# Patient Record
Sex: Female | Born: 1965 | Race: White | Hispanic: No | Marital: Married | Smoking: Current every day smoker
Health system: Southern US, Community
[De-identification: ages and names within clinical notes are randomized; demographics above are authoritative.]

## PROBLEM LIST (undated history)

## (undated) DIAGNOSIS — E119 Type 2 diabetes mellitus without complications: Secondary | ICD-10-CM

## (undated) DIAGNOSIS — E785 Hyperlipidemia, unspecified: Secondary | ICD-10-CM

## (undated) DIAGNOSIS — F32A Depression, unspecified: Secondary | ICD-10-CM

## (undated) DIAGNOSIS — Z9989 Dependence on other enabling machines and devices: Secondary | ICD-10-CM

## (undated) DIAGNOSIS — F329 Major depressive disorder, single episode, unspecified: Secondary | ICD-10-CM

## (undated) DIAGNOSIS — K760 Fatty (change of) liver, not elsewhere classified: Secondary | ICD-10-CM

## (undated) DIAGNOSIS — J189 Pneumonia, unspecified organism: Secondary | ICD-10-CM

## (undated) DIAGNOSIS — M1990 Unspecified osteoarthritis, unspecified site: Secondary | ICD-10-CM

## (undated) DIAGNOSIS — I10 Essential (primary) hypertension: Secondary | ICD-10-CM

## (undated) DIAGNOSIS — G43909 Migraine, unspecified, not intractable, without status migrainosus: Secondary | ICD-10-CM

## (undated) DIAGNOSIS — K219 Gastro-esophageal reflux disease without esophagitis: Secondary | ICD-10-CM

## (undated) DIAGNOSIS — J329 Chronic sinusitis, unspecified: Secondary | ICD-10-CM

## (undated) DIAGNOSIS — I493 Ventricular premature depolarization: Secondary | ICD-10-CM

## (undated) DIAGNOSIS — G4733 Obstructive sleep apnea (adult) (pediatric): Secondary | ICD-10-CM

## (undated) HISTORY — PX: LAPAROSCOPIC CHOLECYSTECTOMY: SUR755

## (undated) HISTORY — DX: Essential (primary) hypertension: I10

## (undated) HISTORY — DX: Ventricular premature depolarization: I49.3

## (undated) HISTORY — DX: Depression, unspecified: F32.A

## (undated) HISTORY — DX: Hyperlipidemia, unspecified: E78.5

## (undated) HISTORY — DX: Unspecified osteoarthritis, unspecified site: M19.90

## (undated) HISTORY — DX: Gastro-esophageal reflux disease without esophagitis: K21.9

## (undated) HISTORY — DX: Major depressive disorder, single episode, unspecified: F32.9

## (undated) HISTORY — DX: Fatty (change of) liver, not elsewhere classified: K76.0

---

## 2004-11-01 DIAGNOSIS — J189 Pneumonia, unspecified organism: Secondary | ICD-10-CM

## 2004-11-01 HISTORY — DX: Pneumonia, unspecified organism: J18.9

## 2011-01-07 DIAGNOSIS — N879 Dysplasia of cervix uteri, unspecified: Secondary | ICD-10-CM | POA: Insufficient documentation

## 2011-01-07 DIAGNOSIS — K219 Gastro-esophageal reflux disease without esophagitis: Secondary | ICD-10-CM | POA: Insufficient documentation

## 2011-01-07 DIAGNOSIS — E785 Hyperlipidemia, unspecified: Secondary | ICD-10-CM | POA: Insufficient documentation

## 2012-12-27 LAB — HM PAP SMEAR: HM Pap smear: NEGATIVE

## 2013-04-23 DIAGNOSIS — M76821 Posterior tibial tendinitis, right leg: Secondary | ICD-10-CM | POA: Insufficient documentation

## 2014-12-04 DIAGNOSIS — I493 Ventricular premature depolarization: Secondary | ICD-10-CM | POA: Insufficient documentation

## 2015-11-02 HISTORY — PX: THROAT SURGERY: SHX803

## 2016-06-04 LAB — HM COLONOSCOPY

## 2017-03-01 HISTORY — PX: FOOT TENDON SURGERY: SHX958

## 2017-09-29 ENCOUNTER — Encounter: Payer: Self-pay | Admitting: Family Medicine

## 2017-10-11 ENCOUNTER — Ambulatory Visit: Payer: Self-pay | Admitting: Family Medicine

## 2017-10-13 ENCOUNTER — Ambulatory Visit: Payer: Self-pay | Admitting: Family Medicine

## 2017-10-13 ENCOUNTER — Encounter: Payer: Self-pay | Admitting: Family Medicine

## 2017-10-13 ENCOUNTER — Other Ambulatory Visit: Payer: Self-pay

## 2017-10-13 VITALS — BP 132/82 | HR 80 | Temp 98.4°F | Resp 18 | Ht 66.5 in | Wt 205.6 lb

## 2017-10-13 DIAGNOSIS — E785 Hyperlipidemia, unspecified: Secondary | ICD-10-CM

## 2017-10-13 DIAGNOSIS — E669 Obesity, unspecified: Secondary | ICD-10-CM

## 2017-10-13 DIAGNOSIS — F320 Major depressive disorder, single episode, mild: Secondary | ICD-10-CM

## 2017-10-13 DIAGNOSIS — F329 Major depressive disorder, single episode, unspecified: Secondary | ICD-10-CM | POA: Insufficient documentation

## 2017-10-13 DIAGNOSIS — Z23 Encounter for immunization: Secondary | ICD-10-CM

## 2017-10-13 DIAGNOSIS — G4733 Obstructive sleep apnea (adult) (pediatric): Secondary | ICD-10-CM

## 2017-10-13 DIAGNOSIS — E1169 Type 2 diabetes mellitus with other specified complication: Secondary | ICD-10-CM

## 2017-10-13 DIAGNOSIS — Z794 Long term (current) use of insulin: Secondary | ICD-10-CM

## 2017-10-13 DIAGNOSIS — M25559 Pain in unspecified hip: Secondary | ICD-10-CM

## 2017-10-13 DIAGNOSIS — M25552 Pain in left hip: Secondary | ICD-10-CM

## 2017-10-13 DIAGNOSIS — E119 Type 2 diabetes mellitus without complications: Secondary | ICD-10-CM

## 2017-10-13 DIAGNOSIS — M79671 Pain in right foot: Secondary | ICD-10-CM

## 2017-10-13 DIAGNOSIS — M25551 Pain in right hip: Secondary | ICD-10-CM

## 2017-10-13 DIAGNOSIS — I10 Essential (primary) hypertension: Secondary | ICD-10-CM

## 2017-10-13 DIAGNOSIS — G8929 Other chronic pain: Secondary | ICD-10-CM

## 2017-10-13 DIAGNOSIS — Z720 Tobacco use: Secondary | ICD-10-CM

## 2017-10-13 MED ORDER — DICLOFENAC SODIUM 50 MG PO TBEC: 50.0000 mg | DELAYED_RELEASE_TABLET | Freq: Two times a day (BID) | ORAL | 0 refills | Status: DC

## 2017-10-13 NOTE — Assessment & Plan Note (Signed)
She is on CPAP.  We will obtain her sleep study records.  We will need to find a local provider when she has insurance to continue her supplies

## 2017-10-13 NOTE — Assessment & Plan Note (Signed)
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  I did provide her with a letter for her work.  She will continue the gabapentin.  We will need to hold off on orthopedics until she has insurance and she will reestablish care for ongoing issues with her foot.  I will get the records from her previous orthopedist.

## 2017-10-13 NOTE — Assessment & Plan Note (Signed)
Blood pressure looks okay.  She is on lisinopril renal function. I will delay her cholesterol panel in her urine micro until the next visit hopefully she will have insurance by then.

## 2017-10-13 NOTE — Assessment & Plan Note (Signed)
Continue the 50 mg of the Zoloft for now.  She has enough medication to keep cutting in half.  We will see how she does not continue to try to taper her off.

## 2017-10-13 NOTE — Progress Notes (Addendum)
Subjective:    Patient ID: Heather Sloan, female    DOB: 08/11/1966, 51 y.o.   MRN: [id number]  Patient presents for new pt get established (not fasting)  Pt here to establish care  Previous PCP- Dr. Wyline MoodLavi Rohatgi  Specialist-  Orthopedics at [facility]    DM- Last A1C  8.3% in Feb 2018 , currently on Novolog 10 units with meals and Lantus 45 units at bedtime    Lisinopril, Metformin 1000mg  BID   Was on glipizide in past States she is glutean intolerant  Weight- was down to 185 about 1.5 years ago, drinks diet soda, water, coffee   Has strong family history   GERD- Prilosec    Hyperlipidemia- zocor 40mg , no difficulty   Depression/anxiety- Zoloft 100mg  , started weaning down a few months ago, doing okay on 50mg  states that she had been under a lot of stress with her sister-in-law when she was living with them.  Also her husband has a lot of medical problems which is 1 of the reasons [place].  Her children are going to be moving down in the next year as well.   Has had 2 surgeries on her post tibitial tendon on right foot- last in May 2018, Had Physical  , was in a cast for 12 weeks had severe muscle atrophy. Still has weakness in left, callus and pain on heel. Needs f/u with local orthopdics. She is on gabapentin for neve damage  Has chronic hip pain as well   Arthritis-  Diclofenac 40mg  BID  Hypertension- taking lisinopril without difficulty   OSA- wears CPAP,    Vitamin D taking supplement     Has had lapraoscopy procedure for endometrisos but not found   Carpal tunnel syndrome- wears at night    Works at [employer]- needs doctors note to sit at work due to foot pain  Currently uninsured   She has had mammogram/colonoscopy TDAP/Pneumonia UTD   Review Of Systems:  GEN- denies fatigue, fever, weight loss,weakness, recent illness HEENT- denies eye drainage, change in vision, nasal discharge, CVS- denies chest pain,  palpitations RESP- denies SOB, cough, wheeze ABD- denies N/V, change in stools, abd pain GU- denies dysuria, hematuria, dribbling, incontinence MSK- + joint pain, muscle aches, injury Neuro- denies headache, dizziness, syncope, seizure activity       Objective:    BP 132/82 (BP Location: Right Arm, Patient Position: Sitting, Cuff Size: Large)   Pulse 80   Temp 98.4 F (36.9 C) (Oral)   Resp 18   Ht 5' 6.5" (1.689 m)   Wt 205 lb 9.6 oz (93.3 kg)   LMP 09/02/2011 (Approximate)   BMI 32.69 kg/m  GEN- NAD, alert and oriented x3 HEENT- PERRL, EOMI, non injected sclera, pink conjunctiva, MMM, oropharynx clear Neck- Supple, no thyromegaly CVS- RRR, no murmur RESP-CTAB ABD-NABS,soft,NT,ND Psych- normal affect and mood  EXT- No edema, Right Foot- has callus medial aspect ofheel, scars bilat heel near ankles  Pulses- Radial, DP- 2+        Assessment & Plan:      Problem List Items Addressed This Visit      Unprioritized   Type 2 diabetes mellitus without complications (HCC)    Uncontrolled, check A1C As uninsured and running out of her insulin I have given her Toujeo samples she will continue 45 units and 10 units of NovoLog with each meal for now      Relevant Orders   CBC  with Differential/Platelet   Comprehensive metabolic panel   Hemoglobin A1c   Tobacco use   OSA (obstructive sleep apnea)    She is on CPAP.  We will obtain her sleep study records.  We will need to find a local provider when she has insurance to continue her supplies      Obesity (BMI 30-39.9)   Major depression    Continue the 50 mg of the Zoloft for now.  She has enough medication to keep cutting in half.  We will see how she does not continue to try to taper her off.      Hypertension    Blood pressure looks okay.  She is on lisinopril renal function. I will delay her cholesterol panel in her urine micro until the next visit hopefully she will have insurance by then.      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  I did provide her with a letter for her work.  She will continue the gabapentin.  We will need to hold off on orthopedics until she has insurance and she will reestablish care for ongoing issues with her foot.  I will get the records from her previous orthopedist.      Relevant Medications   diclofenac (VOLTAREN) 50 MG EC tablet    Other Visit Diagnoses    Need for prophylactic vaccination and inoculation against influenza    -  Primary   Relevant Orders   Flu Vaccine QUAD 6+ mos PF IM (Fluarix Quad PF) (Completed)      Note: This dictation was prepared with Dragon dictation along with smaller phrase technology. Any transcriptional errors that result from this process are unintentional.

## 2017-10-13 NOTE — Patient Instructions (Addendum)
Release of records  Take equivalent of Tujeo for the lantus for now  We will call with lab results Flu shot done F/U 3 months

## 2017-10-13 NOTE — Assessment & Plan Note (Signed)
Uncontrolled, check A1C As uninsured and running out of her insulin I have given her Toujeo samples she will continue 45 units and 10 units of NovoLog with each meal for now

## 2017-10-14 LAB — CBC WITH DIFFERENTIAL/PLATELET
Basophils Absolute: 124 cells/uL (ref 0–200)
Basophils Relative: 1 %
Eosinophils Absolute: 422 cells/uL (ref 15–500)
Eosinophils Relative: 3.4 %
HCT: 43.4 % (ref 35.0–45.0)
Hemoglobin: 14 g/dL (ref 11.7–15.5)
Lymphs Abs: 2753 cells/uL (ref 850–3900)
MCH: 29 pg (ref 27.0–33.0)
MCHC: 32.3 g/dL (ref 32.0–36.0)
MCV: 90 fL (ref 80.0–100.0)
MPV: 9.2 fL (ref 7.5–12.5)
Monocytes Relative: 8.5 %
Neutro Abs: 8048 cells/uL — ABNORMAL HIGH (ref 1500–7800)
Neutrophils Relative %: 64.9 %
Platelets: 316 10*3/uL (ref 140–400)
RBC: 4.82 10*6/uL (ref 3.80–5.10)
RDW: 12.4 % (ref 11.0–15.0)
Total Lymphocyte: 22.2 %
WBC mixed population: 1054 cells/uL — ABNORMAL HIGH (ref 200–950)
WBC: 12.4 10*3/uL — ABNORMAL HIGH (ref 3.8–10.8)

## 2017-10-14 LAB — COMPREHENSIVE METABOLIC PANEL
AG Ratio: 1.4 (calc) (ref 1.0–2.5)
ALT: 21 U/L (ref 6–29)
AST: 19 U/L (ref 10–35)
Albumin: 4.2 g/dL (ref 3.6–5.1)
Alkaline phosphatase (APISO): 68 U/L (ref 33–130)
BUN: 11 mg/dL (ref 7–25)
CO2: 23 mmol/L (ref 20–32)
Calcium: 10 mg/dL (ref 8.6–10.4)
Chloride: 103 mmol/L (ref 98–110)
Creat: 0.87 mg/dL (ref 0.50–1.05)
Globulin: 3 g/dL (calc) (ref 1.9–3.7)
Glucose, Bld: 202 mg/dL — ABNORMAL HIGH (ref 65–99)
Potassium: 4.4 mmol/L (ref 3.5–5.3)
Sodium: 137 mmol/L (ref 135–146)
Total Bilirubin: 0.4 mg/dL (ref 0.2–1.2)
Total Protein: 7.2 g/dL (ref 6.1–8.1)

## 2017-10-14 LAB — HEMOGLOBIN A1C
Hgb A1c MFr Bld: 7.3 % of total Hgb — ABNORMAL HIGH (ref <5.7)
Mean Plasma Glucose: 163 (calc)
eAG (mmol/L): 9 (calc)

## 2017-10-19 ENCOUNTER — Other Ambulatory Visit: Payer: Self-pay | Admitting: *Deleted

## 2017-10-19 MED ORDER — CEPHALEXIN 500 MG PO CAPS: 500.0000 mg | ORAL_CAPSULE | Freq: Two times a day (BID) | ORAL | 0 refills | Status: DC

## 2017-11-04 ENCOUNTER — Telehealth: Payer: Self-pay | Admitting: Family Medicine

## 2017-11-04 MED ORDER — INSULIN GLARGINE 300 UNIT/ML ~~LOC~~ SOPN: 45.0000 [IU] | PEN_INJECTOR | Freq: Every day | SUBCUTANEOUS | 11 refills | Status: DC

## 2017-11-04 NOTE — Telephone Encounter (Signed)
Prescription sent to pharmacy.

## 2017-11-04 NOTE — Telephone Encounter (Signed)
Patient need tojeo today if possible to be called into to [facility] if possible

## 2017-11-07 ENCOUNTER — Encounter: Payer: Self-pay | Admitting: Family Medicine

## 2017-11-08 ENCOUNTER — Encounter: Payer: Self-pay | Admitting: *Deleted

## 2017-11-16 ENCOUNTER — Other Ambulatory Visit: Payer: Self-pay | Admitting: *Deleted

## 2017-11-16 ENCOUNTER — Telehealth: Payer: Self-pay | Admitting: Family Medicine

## 2017-11-16 MED ORDER — GABAPENTIN 600 MG PO TABS: 600.0000 mg | ORAL_TABLET | Freq: Every day | ORAL | 3 refills | Status: DC

## 2017-11-16 MED ORDER — SIMVASTATIN 40 MG PO TABS: 40.0000 mg | ORAL_TABLET | Freq: Every day | ORAL | 3 refills | Status: DC

## 2017-11-16 MED ORDER — OMEPRAZOLE 40 MG PO CPDR: 40.0000 mg | DELAYED_RELEASE_CAPSULE | Freq: Every day | ORAL | 3 refills | Status: DC

## 2017-11-16 NOTE — Telephone Encounter (Signed)
pts refills were sent to the pharmacy but [facility] was going to charge over $700.00 for refills she wants to know if we can send prescriptions to [facility] because it will be cheaper since she is self pay (gabapentin, omeprazole, simvastatin)

## 2017-11-17 MED ORDER — SIMVASTATIN 40 MG PO TABS: 40.0000 mg | ORAL_TABLET | Freq: Every day | ORAL | 3 refills | Status: DC

## 2017-11-17 MED ORDER — GABAPENTIN 600 MG PO TABS: 600.0000 mg | ORAL_TABLET | Freq: Every day | ORAL | 3 refills | Status: DC

## 2017-11-17 MED ORDER — OMEPRAZOLE 40 MG PO CPDR: 40.0000 mg | DELAYED_RELEASE_CAPSULE | Freq: Every day | ORAL | 3 refills | Status: DC

## 2017-11-17 NOTE — Telephone Encounter (Signed)
Prescription sent to pharmacy.

## 2017-12-28 ENCOUNTER — Encounter: Payer: Self-pay | Admitting: Family Medicine

## 2017-12-28 NOTE — Telephone Encounter (Signed)
Error

## 2017-12-28 NOTE — Telephone Encounter (Signed)
This encounter was created in error - please disregard.

## 2018-01-11 ENCOUNTER — Ambulatory Visit: Payer: Self-pay | Admitting: Family Medicine

## 2018-01-13 ENCOUNTER — Other Ambulatory Visit: Payer: Self-pay

## 2018-01-13 ENCOUNTER — Encounter: Payer: Self-pay | Admitting: Family Medicine

## 2018-01-13 ENCOUNTER — Ambulatory Visit (INDEPENDENT_AMBULATORY_CARE_PROVIDER_SITE_OTHER): Payer: Self-pay | Admitting: Family Medicine

## 2018-01-13 VITALS — BP 128/68 | HR 76 | Temp 98.3°F | Resp 14 | Ht 66.5 in | Wt 200.0 lb

## 2018-01-13 DIAGNOSIS — I10 Essential (primary) hypertension: Secondary | ICD-10-CM

## 2018-01-13 DIAGNOSIS — B351 Tinea unguium: Secondary | ICD-10-CM

## 2018-01-13 DIAGNOSIS — E119 Type 2 diabetes mellitus without complications: Secondary | ICD-10-CM

## 2018-01-13 DIAGNOSIS — Z794 Long term (current) use of insulin: Secondary | ICD-10-CM

## 2018-01-13 MED ORDER — METFORMIN HCL 1000 MG PO TABS: 1000.0000 mg | ORAL_TABLET | Freq: Two times a day (BID) | ORAL | 1 refills | Status: DC

## 2018-01-13 MED ORDER — DICLOFENAC SODIUM 75 MG PO TBEC: 75.0000 mg | DELAYED_RELEASE_TABLET | Freq: Two times a day (BID) | ORAL | 1 refills | Status: DC

## 2018-01-13 MED ORDER — LISINOPRIL 10 MG PO TABS: 10.0000 mg | ORAL_TABLET | Freq: Every day | ORAL | 1 refills | Status: DC

## 2018-01-13 MED ORDER — GABAPENTIN 300 MG PO CAPS: 300.0000 mg | ORAL_CAPSULE | Freq: Three times a day (TID) | ORAL | 1 refills | Status: DC

## 2018-01-13 NOTE — Progress Notes (Signed)
   Subjective:    Patient ID: Heather Sloan, female    DOB: [birth year], 52 y.o.   MRN: [id number]  Patient presents for Follow-up (is not fasting)   Pt here to f/u hronic medical problems    DM- 7.3%, drops at works , used glucose tablets a few times, did not have meter with her today   25 units of Tresiba  10 Novolog with breakfast and dinner   , states CBG 90-150 in evening   Nail fungus on Left thumb bilat great toenials for almost a year wants to try anti-fungal once insurance kicks in   Review Of Systems:  GEN- denies fatigue, fever, weight loss,weakness, recent illness HEENT- denies eye drainage, change in vision, nasal discharge, CVS- denies chest pain, palpitations RESP- denies SOB, cough, wheeze ABD- denies N/V, change in stools, abd pain GU- denies dysuria, hematuria, dribbling, incontinence MSK- denies joint pain, muscle aches, injury Neuro- denies headache, dizziness, syncope, seizure activity       Objective:    BP 128/68   Pulse 76   Temp 98.3 F (36.8 C) (Oral)   Resp 14   Ht 5' 6.5" (1.689 m)   Wt 200 lb (90.7 kg)   LMP 09/02/2011 (Approximate)   SpO2 96%   BMI 31.80 kg/m  GEN- NAD, alert and oriented x3 HEENT- PERRL, EOMI, non injected sclera, pink conjunctiva, MMM, oropharynx clear CVS- RRR, no murmur RESP-CTAB Skin- bilat great toenails, dicoloration with thicking and striations, similar appearance Left great thumbnail EXT- No edema Pulses- Radial, DP- 2+        Assessment & Plan:      Problem List Items Addressed This Visit      Unprioritized   Onychomycosis   Type 2 diabetes mellitus without complications (HCC) - Primary    Plan for her to have labs in April to recheck A1C Since she is getting low sugars mid day, decrease her Novlog to 5units Continue the [place]iba or Tujeo which ever she has samples of - today given Tresiba, 25 units at bedtime   For nail fungus await insurance then will discuss oral vs topical treatment as she is on  statin drug       Relevant Medications   metFORMIN (GLUCOPHAGE) 1000 MG tablet   lisinopril (PRINIVIL,ZESTRIL) 10 MG tablet   Hypertension    Well controlled Time spent coordinating all her other medications to local pharmacy for cheaper prices       Relevant Medications   lisinopril (PRINIVIL,ZESTRIL) 10 MG tablet      Note: This dictation was prepared with Dragon dictation along with smaller phrase technology. Any transcriptional errors that result from this process are unintentional.

## 2018-01-13 NOTE — Patient Instructions (Addendum)
Call in April once insurance is on, labs will be done Decrease novolog to 5 units Continue Tresiba 25units F/U3 months

## 2018-01-15 ENCOUNTER — Encounter: Payer: Self-pay | Admitting: Family Medicine

## 2018-01-15 NOTE — Assessment & Plan Note (Signed)
Well controlled Time spent coordinating all her other medications to local pharmacy for cheaper prices

## 2018-01-15 NOTE — Assessment & Plan Note (Signed)
Plan for her to have labs in April to recheck A1C Since she is getting low sugars mid day, decrease her Novlog to 5units Continue the [place]iba or Tujeo which ever she has samples of - today given Tresiba, 25 units at bedtime   For nail fungus await insurance then will discuss oral vs topical treatment as she is on statin drug

## 2018-02-06 ENCOUNTER — Telehealth: Payer: Self-pay | Admitting: Family Medicine

## 2018-02-06 NOTE — Telephone Encounter (Signed)
Call placed to patient to clarify. LMTRC.

## 2018-02-06 NOTE — Telephone Encounter (Signed)
Pt called requesting medicine for toenail fungus states that she was seen for it and told to call in if she needed med. Pt also requesting refill on all medications "ALL" to [facility] please if possible. (coltaren, gabapentin, metformin, lisinopril, prilosec, simvastatin, toujeo, and novolog)

## 2018-02-07 ENCOUNTER — Other Ambulatory Visit: Payer: BLUE CROSS/BLUE SHIELD

## 2018-02-07 DIAGNOSIS — Z1159 Encounter for screening for other viral diseases: Secondary | ICD-10-CM

## 2018-02-07 DIAGNOSIS — E1169 Type 2 diabetes mellitus with other specified complication: Secondary | ICD-10-CM

## 2018-02-07 DIAGNOSIS — E119 Type 2 diabetes mellitus without complications: Secondary | ICD-10-CM

## 2018-02-07 DIAGNOSIS — I10 Essential (primary) hypertension: Secondary | ICD-10-CM

## 2018-02-07 DIAGNOSIS — Z794 Long term (current) use of insulin: Principal | ICD-10-CM

## 2018-02-07 DIAGNOSIS — E785 Hyperlipidemia, unspecified: Secondary | ICD-10-CM

## 2018-02-07 MED ORDER — OMEPRAZOLE 40 MG PO CPDR: 40.0000 mg | DELAYED_RELEASE_CAPSULE | Freq: Every day | ORAL | 3 refills | Status: DC

## 2018-02-07 MED ORDER — INSULIN DEGLUDEC 100 UNIT/ML ~~LOC~~ SOPN: 25.0000 [IU] | PEN_INJECTOR | Freq: Every day | SUBCUTANEOUS | 3 refills | Status: DC

## 2018-02-07 MED ORDER — GABAPENTIN 300 MG PO CAPS: 300.0000 mg | ORAL_CAPSULE | Freq: Three times a day (TID) | ORAL | 3 refills | Status: DC

## 2018-02-07 MED ORDER — METFORMIN HCL 1000 MG PO TABS: 1000.0000 mg | ORAL_TABLET | Freq: Two times a day (BID) | ORAL | 3 refills | Status: DC

## 2018-02-07 MED ORDER — LISINOPRIL 10 MG PO TABS: 10.0000 mg | ORAL_TABLET | Freq: Every day | ORAL | 3 refills | Status: DC

## 2018-02-07 MED ORDER — DICLOFENAC SODIUM 75 MG PO TBEC: 75.0000 mg | DELAYED_RELEASE_TABLET | Freq: Two times a day (BID) | ORAL | 3 refills | Status: DC

## 2018-02-07 MED ORDER — CICLOPIROX 8 % EX SOLN: Freq: Every day | CUTANEOUS | 3 refills | Status: DC

## 2018-02-07 MED ORDER — INSULIN ASPART 100 UNIT/ML ~~LOC~~ SOLN: 5.0000 [IU] | Freq: Three times a day (TID) | SUBCUTANEOUS | 3 refills | Status: DC

## 2018-02-07 NOTE — Telephone Encounter (Signed)
Prescription sent to pharmacy.

## 2018-02-07 NOTE — Telephone Encounter (Signed)
Send [place] apply to nails as directed

## 2018-02-07 NOTE — Telephone Encounter (Signed)
Patient in office to have labs drawn.   Discussed medication refills and routine medications sent to pharmacy.   Patient requesting fungal medication for thumb nail. MD please advise.

## 2018-02-08 LAB — HEMOGLOBIN A1C
Hgb A1c MFr Bld: 7.6 % of total Hgb — ABNORMAL HIGH (ref <5.7)
Mean Plasma Glucose: 171 (calc)
eAG (mmol/L): 9.5 (calc)

## 2018-02-08 LAB — CBC WITH DIFFERENTIAL/PLATELET
Basophils Absolute: 91 cells/uL (ref 0–200)
Basophils Relative: 0.9 %
Eosinophils Absolute: 374 cells/uL (ref 15–500)
Eosinophils Relative: 3.7 %
HCT: 39.5 % (ref 35.0–45.0)
Hemoglobin: 13.3 g/dL (ref 11.7–15.5)
Lymphs Abs: 3414 cells/uL (ref 850–3900)
MCH: 30.3 pg (ref 27.0–33.0)
MCHC: 33.7 g/dL (ref 32.0–36.0)
MCV: 90 fL (ref 80.0–100.0)
MPV: 9.4 fL (ref 7.5–12.5)
Monocytes Relative: 8.9 %
Neutro Abs: 5323 cells/uL (ref 1500–7800)
Neutrophils Relative %: 52.7 %
Platelets: 309 10*3/uL (ref 140–400)
RBC: 4.39 10*6/uL (ref 3.80–5.10)
RDW: 11.9 % (ref 11.0–15.0)
Total Lymphocyte: 33.8 %
WBC mixed population: 899 cells/uL (ref 200–950)
WBC: 10.1 10*3/uL (ref 3.8–10.8)

## 2018-02-08 LAB — COMPLETE METABOLIC PANEL WITH GFR
AG Ratio: 1.8 (calc) (ref 1.0–2.5)
ALT: 21 U/L (ref 6–29)
AST: 16 U/L (ref 10–35)
Albumin: 4.2 g/dL (ref 3.6–5.1)
Alkaline phosphatase (APISO): 59 U/L (ref 33–130)
BUN: 13 mg/dL (ref 7–25)
CO2: 25 mmol/L (ref 20–32)
Calcium: 9.4 mg/dL (ref 8.6–10.4)
Chloride: 106 mmol/L (ref 98–110)
Creat: 0.89 mg/dL (ref 0.50–1.05)
GFR, Est African American: 87 mL/min/{1.73_m2} (ref >=60)
GFR, Est Non African American: 75 mL/min/{1.73_m2} (ref >=60)
Globulin: 2.4 g/dL (calc) (ref 1.9–3.7)
Glucose, Bld: 163 mg/dL — ABNORMAL HIGH (ref 65–99)
Potassium: 5.3 mmol/L (ref 3.5–5.3)
Sodium: 139 mmol/L (ref 135–146)
Total Bilirubin: 0.3 mg/dL (ref 0.2–1.2)
Total Protein: 6.6 g/dL (ref 6.1–8.1)

## 2018-02-08 LAB — MICROALBUMIN / CREATININE URINE RATIO
Creatinine, Urine: 456 mg/dL — ABNORMAL HIGH (ref 20–275)
Microalb Creat Ratio: 5 mcg/mg creat (ref <30)
Microalb, Ur: 2.5 mg/dL

## 2018-02-08 LAB — HEPATITIS C ANTIBODY
Hepatitis C Ab: NONREACTIVE
SIGNAL TO CUT-OFF: 0.03 (ref <1.00)

## 2018-02-08 LAB — LIPID PANEL
Cholesterol: 154 mg/dL (ref <200)
HDL: 37 mg/dL — ABNORMAL LOW (ref >50)
LDL Cholesterol (Calc): 96 mg/dL (calc)
Non-HDL Cholesterol (Calc): 117 mg/dL (calc) (ref <130)
Total CHOL/HDL Ratio: 4.2 (calc) (ref <5.0)
Triglycerides: 111 mg/dL (ref <150)

## 2018-02-10 ENCOUNTER — Other Ambulatory Visit: Payer: Self-pay | Admitting: *Deleted

## 2018-02-10 ENCOUNTER — Encounter: Payer: Self-pay | Admitting: *Deleted

## 2018-02-10 MED ORDER — LISINOPRIL 20 MG PO TABS: 20.0000 mg | ORAL_TABLET | Freq: Every day | ORAL | 3 refills | Status: DC

## 2018-03-20 ENCOUNTER — Other Ambulatory Visit: Payer: Self-pay | Admitting: *Deleted

## 2018-03-20 MED ORDER — METFORMIN HCL 1000 MG PO TABS: 1000.0000 mg | ORAL_TABLET | Freq: Two times a day (BID) | ORAL | 3 refills | Status: DC

## 2018-03-22 ENCOUNTER — Other Ambulatory Visit: Payer: Self-pay | Admitting: *Deleted

## 2018-03-22 MED ORDER — GABAPENTIN 300 MG PO CAPS: 300.0000 mg | ORAL_CAPSULE | Freq: Three times a day (TID) | ORAL | 3 refills | Status: DC

## 2018-04-12 ENCOUNTER — Ambulatory Visit (INDEPENDENT_AMBULATORY_CARE_PROVIDER_SITE_OTHER): Payer: Self-pay | Admitting: Family Medicine

## 2018-04-12 ENCOUNTER — Encounter: Payer: Self-pay | Admitting: Family Medicine

## 2018-04-12 ENCOUNTER — Other Ambulatory Visit: Payer: Self-pay

## 2018-04-12 VITALS — BP 150/90 | HR 71 | Temp 98.9°F | Resp 16 | Wt 201.0 lb

## 2018-04-12 DIAGNOSIS — J0141 Acute recurrent pansinusitis: Secondary | ICD-10-CM

## 2018-04-12 DIAGNOSIS — J302 Other seasonal allergic rhinitis: Secondary | ICD-10-CM

## 2018-04-12 MED ORDER — AMOXICILLIN-POT CLAVULANATE 875-125 MG PO TABS: 1.0000 | ORAL_TABLET | Freq: Two times a day (BID) | ORAL | 0 refills | Status: DC

## 2018-04-12 MED ORDER — MONTELUKAST SODIUM 10 MG PO TABS: 10.0000 mg | ORAL_TABLET | Freq: Every day | ORAL | 1 refills | Status: DC

## 2018-04-12 NOTE — Patient Instructions (Addendum)
Get over the counter flonase, nasonex or generic equivalent, start using daily, one spray in each nostril  Continue daily antihistamine - zyrtec, claritin, or allegra  Add montelukast daily to help with watery eyes, nasal discharge  Follow up if not improving in 5-7 days

## 2018-04-12 NOTE — Progress Notes (Signed)
Patient ID: Heather Sloan, female    DOB: Apr 06, 1966, 52 y.o.   MRN: [id number]  PCP: Salley Scarleturham, Kawanta F, MD  Chief Complaint  Patient presents with  . Sinusitis    Has c/o ear pain, nasal congestion, watering eyes, postnasal drip    Subjective:   Heather Sloan is a 52 y.o. female, presents to clinic with CC of 7 days of worsening nasal congestion pressure with profuse discharge, postnasal drip, watery eyes, blocked and popping ears with pain, also scratchy sore throat and now nonproductive cough.  She has slight headache behind her eyes and some pain and achiness in the back of her head radiating down her neck.  Her sugars are elevated and she feels generally ill.  She denies any fever, sweats, chills, nausea, vomiting, neck stiffness.  Is insulin-dependent diabetic, has a history of recurrent sinusitis at least 2 times a year which usually requires antibiotics to treat.  She states that she has been taking over-the-counter antihistamine for the past week without any improvement.     Patient Active Problem List   Diagnosis Date Noted  . Onychomycosis 01/13/2018  . Type 2 diabetes mellitus without complications (HCC) 10/13/2017  . Hypertension 10/13/2017  . Hyperlipidemia associated with type 2 diabetes mellitus (HCC) 10/13/2017  . OSA (obstructive sleep apnea) 10/13/2017  . Chronic foot pain, right 10/13/2017  . Chronic hip pain 10/13/2017  . Major depression 10/13/2017  . Tobacco use 10/13/2017  . Obesity (BMI 30-39.9) 10/13/2017  . Ventricular premature depolarization 12/04/2014  . Posterior tibial tendon dysfunction (PTTD) of right lower extremity 04/23/2013  . Hyperlipidemia 01/07/2011  . Gastroesophageal reflux disease 01/07/2011  . Cervical dysplasia 01/07/2011     Prior to Admission medications   Medication Sig Start Date End Date Taking? Authorizing Provider  aspirin 81 MG chewable tablet Chew by mouth daily.   Yes [provider]  ciclopirox (PENLAC) 8 % solution  Apply topically at bedtime. Apply over nail and skin daily over previous coat. After 7 days remove with alcohol and continue cycle. 02/07/18  Yes [place], Velna HatchetKawanta F, MD  diclofenac (VOLTAREN) 75 MG EC tablet Take 1 tablet (75 mg total) by mouth 2 (two) times daily. 02/07/18  Yes [place], Velna HatchetKawanta F, MD  gabapentin (NEURONTIN) 300 MG capsule Take 1 capsule (300 mg total) by mouth 3 (three) times daily. 03/22/18  Yes [place], Velna HatchetKawanta F, MD  insulin aspart (NOVOLOG) 100 UNIT/ML injection Inject 5 Units into the skin 3 (three) times daily before meals. 02/07/18  Yes [place], Velna HatchetKawanta F, MD  insulin degludec (TRESIBA FLEXTOUCH) 100 UNIT/ML SOPN FlexTouch Pen Inject 0.25 mLs (25 Units total) into the skin daily at 10 pm. 02/07/18  Yes [place], Velna HatchetKawanta F, MD  lisinopril (PRINIVIL,ZESTRIL) 20 MG tablet Take 1 tablet (20 mg total) by mouth daily. 02/10/18  Yes [place], Velna HatchetKawanta F, MD  metFORMIN (GLUCOPHAGE) 1000 MG tablet Take 1 tablet (1,000 mg total) by mouth 2 (two) times daily with a meal. 03/20/18  Yes [place], Velna HatchetKawanta F, MD  omeprazole (PRILOSEC) 40 MG capsule Take 1 capsule (40 mg total) by mouth daily. 02/07/18  Yes [place], Velna HatchetKawanta F, MD  simvastatin (ZOCOR) 40 MG tablet Take 1 tablet (40 mg total) by mouth daily at 6 PM. Patient not taking: Reported on 04/12/2018 11/17/17   Salley Scarleturham, Kawanta F, MD     Allergies  Allergen Reactions  . Bactrim [Sulfamethoxazole-Trimethoprim] Hives  . Codeine     Passed out  . Wheat Extract Diarrhea    Irritable  bowel, bloating     Family History  Problem Relation Age of Onset  . Diabetes Mother   . Diabetes Father   . Cancer Father        Abdominal stromal tumor   . Heart disease Father   . Heart disease Sister        HEART ATTACK  . Diabetes Maternal Grandmother   . Cancer Paternal Grandfather        Prostate/Colon Cancer     Social History   Socioeconomic History  . Marital status: Married    Spouse name: Not on file  . Number of children: Not on file  . Years of  education: Not on file  . Highest education level: Not on file  Occupational History  . Not on file  Social Needs  . Financial resource strain: Not on file  . Food insecurity:    Worry: Not on file    Inability: Not on file  . Transportation needs:    Medical: Not on file    Non-medical: Not on file  Tobacco Use  . Smoking status: Current Every Day Smoker    Packs/day: 0.50    Types: Cigarettes    Start date: 10/13/1984  . Smokeless tobacco: Never Used  Substance and Sexual Activity  . Alcohol use: Not on file  . Drug use: No  . Sexual activity: Yes  Lifestyle  . Physical activity:    Days per week: Not on file    Minutes per session: Not on file  . Stress: Not on file  Relationships  . Social connections:    Talks on phone: Not on file    Gets together: Not on file    Attends religious service: Not on file    Active member of club or organization: Not on file    Attends meetings of clubs or organizations: Not on file    Relationship status: Not on file  . Intimate partner violence:    Fear of current or ex partner: Not on file    Emotionally abused: Not on file    Physically abused: Not on file    Forced sexual activity: Not on file  Other Topics Concern  . Not on file  Social History Narrative  . Not on file     Review of Systems  Constitutional: Positive for fatigue. Negative for activity change, appetite change, chills, diaphoresis, fever and unexpected weight change.  HENT: Positive for congestion, ear pain, postnasal drip, rhinorrhea, sinus pressure, sinus pain and sore throat. Negative for ear discharge, facial swelling, hearing loss, mouth sores, nosebleeds, sneezing, tinnitus, trouble swallowing and voice change.   Eyes: Positive for discharge (watery). Negative for photophobia, pain, redness, itching and visual disturbance.  Respiratory: Positive for cough. Negative for apnea, choking, chest tightness, shortness of breath, wheezing and stridor.     Cardiovascular: Negative.  Negative for chest pain, palpitations and leg swelling.  Gastrointestinal: Negative.  Negative for abdominal pain, constipation, diarrhea, nausea and vomiting.  Endocrine: Negative.   Genitourinary: Negative.   Musculoskeletal: Negative.  Negative for myalgias and neck stiffness.  Skin: Negative.   Neurological: Positive for headaches. Negative for dizziness, tremors, seizures, syncope, facial asymmetry, speech difficulty, weakness and light-headedness.  Hematological: Negative.   Psychiatric/Behavioral: Negative.        Objective:    Vitals:   04/12/18 1614  BP: (!) 150/90  Pulse: 71  Resp: 16  Temp: 98.9 F (37.2 C)  TempSrc: Oral  SpO2: 97%  Weight: 201  lb (91.2 kg)      Physical Exam  Constitutional: She is oriented to person, place, and time. She appears well-developed and well-nourished.  Non-toxic appearance. She does not have a sickly appearance. She does not appear ill. No distress.  HENT:  Head: Normocephalic and atraumatic. Head is without right periorbital erythema and without left periorbital erythema.  Right Ear: Hearing, tympanic membrane, external ear and ear canal normal. No mastoid tenderness.  Left Ear: Hearing, tympanic membrane, external ear and ear canal normal. No mastoid tenderness.  Nose: Mucosal edema, rhinorrhea and sinus tenderness present. No epistaxis. Right sinus exhibits no maxillary sinus tenderness and no frontal sinus tenderness. Left sinus exhibits no maxillary sinus tenderness and no frontal sinus tenderness.  Mouth/Throat: Uvula is midline and mucous membranes are normal. Mucous membranes are not pale, not dry and not cyanotic. No trismus in the jaw. No uvula swelling. Posterior oropharyngeal erythema present. No oropharyngeal exudate or tonsillar abscesses. No tonsillar exudate.  Nasal edema and erythema   Eyes: Pupils are equal, round, and reactive to light. Conjunctivae, EOM and lids are normal. Right eye  exhibits discharge. Left eye exhibits discharge. No scleral icterus.  Neck: Normal range of motion and phonation normal. Neck supple. No tracheal deviation present.  Cardiovascular: Normal rate, regular rhythm, normal heart sounds, intact distal pulses and normal pulses. Exam reveals no gallop and no friction rub.  No murmur heard. Pulses:      Radial pulses are 2+ on the right side, and 2+ on the left side.       Posterior tibial pulses are 2+ on the right side, and 2+ on the left side.  Pulmonary/Chest: Effort normal and breath sounds normal. No stridor. No respiratory distress. She has no wheezes. She has no rhonchi. She has no rales. She exhibits no tenderness.  Abdominal: Soft. Normal appearance and bowel sounds are normal. She exhibits no distension and no mass. There is no tenderness. There is no rebound and no guarding.  Musculoskeletal: Normal range of motion. She exhibits no edema, tenderness or deformity.  Lymphadenopathy:    She has no cervical adenopathy.  Neurological: She is alert and oriented to person, place, and time. No sensory deficit. She exhibits normal muscle tone. Coordination and gait normal.  Skin: Skin is warm, dry and intact. Capillary refill takes less than 2 seconds. No rash noted. She is not diaphoretic. No pallor.  Psychiatric: She has a normal mood and affect. Her speech is normal and behavior is normal.  Nursing note and vitals reviewed.         Assessment & Plan:      ICD-10-CM   1. Acute recurrent pansinusitis J01.41   2. Seasonal allergies J30.2     Get over the counter flonase, nasonex or generic equivalent, start using daily, one spray in each nostril  Continue daily antihistamine - zyrtec, claritin, or allegra  Add montelukast daily to help with watery eyes, nasal discharge  Start Abx, augmentin  Follow up if not improving in 5-7 days  Danelle Berry, PA-C 04/12/18 4:24 PM

## 2018-08-04 ENCOUNTER — Observation Stay (HOSPITAL_COMMUNITY)
Admission: EM | Admit: 2018-08-04 | Discharge: 2018-08-05 | Disposition: A | Discharge status: Home / Self Care | Payer: BLUE CROSS/BLUE SHIELD | Attending: Internal Medicine | Admitting: Internal Medicine

## 2018-08-04 ENCOUNTER — Encounter (HOSPITAL_COMMUNITY): Payer: Self-pay

## 2018-08-04 ENCOUNTER — Other Ambulatory Visit: Payer: Self-pay

## 2018-08-04 ENCOUNTER — Emergency Department (HOSPITAL_COMMUNITY): Payer: BLUE CROSS/BLUE SHIELD

## 2018-08-04 DIAGNOSIS — Z885 Allergy status to narcotic agent status: Secondary | ICD-10-CM | POA: Diagnosis not present

## 2018-08-04 DIAGNOSIS — E785 Hyperlipidemia, unspecified: Secondary | ICD-10-CM

## 2018-08-04 DIAGNOSIS — Z794 Long term (current) use of insulin: Secondary | ICD-10-CM | POA: Diagnosis not present

## 2018-08-04 DIAGNOSIS — M1990 Unspecified osteoarthritis, unspecified site: Secondary | ICD-10-CM | POA: Diagnosis not present

## 2018-08-04 DIAGNOSIS — K219 Gastro-esophageal reflux disease without esophagitis: Secondary | ICD-10-CM

## 2018-08-04 DIAGNOSIS — E1169 Type 2 diabetes mellitus with other specified complication: Secondary | ICD-10-CM

## 2018-08-04 DIAGNOSIS — K760 Fatty (change of) liver, not elsewhere classified: Secondary | ICD-10-CM | POA: Diagnosis not present

## 2018-08-04 DIAGNOSIS — B351 Tinea unguium: Secondary | ICD-10-CM | POA: Diagnosis not present

## 2018-08-04 DIAGNOSIS — Z720 Tobacco use: Secondary | ICD-10-CM | POA: Diagnosis present

## 2018-08-04 DIAGNOSIS — Z8249 Family history of ischemic heart disease and other diseases of the circulatory system: Secondary | ICD-10-CM | POA: Diagnosis not present

## 2018-08-04 DIAGNOSIS — G8929 Other chronic pain: Secondary | ICD-10-CM | POA: Diagnosis not present

## 2018-08-04 DIAGNOSIS — R0789 Other chest pain: Principal | ICD-10-CM | POA: Insufficient documentation

## 2018-08-04 DIAGNOSIS — Z881 Allergy status to other antibiotic agents status: Secondary | ICD-10-CM | POA: Insufficient documentation

## 2018-08-04 DIAGNOSIS — Z7982 Long term (current) use of aspirin: Secondary | ICD-10-CM | POA: Diagnosis not present

## 2018-08-04 DIAGNOSIS — Z79899 Other long term (current) drug therapy: Secondary | ICD-10-CM | POA: Insufficient documentation

## 2018-08-04 DIAGNOSIS — I10 Essential (primary) hypertension: Secondary | ICD-10-CM | POA: Diagnosis not present

## 2018-08-04 DIAGNOSIS — R0602 Shortness of breath: Secondary | ICD-10-CM | POA: Diagnosis not present

## 2018-08-04 DIAGNOSIS — F17210 Nicotine dependence, cigarettes, uncomplicated: Secondary | ICD-10-CM | POA: Diagnosis not present

## 2018-08-04 DIAGNOSIS — Z882 Allergy status to sulfonamides status: Secondary | ICD-10-CM | POA: Insufficient documentation

## 2018-08-04 DIAGNOSIS — G4733 Obstructive sleep apnea (adult) (pediatric): Secondary | ICD-10-CM | POA: Diagnosis not present

## 2018-08-04 DIAGNOSIS — E119 Type 2 diabetes mellitus without complications: Secondary | ICD-10-CM

## 2018-08-04 DIAGNOSIS — E668 Other obesity: Secondary | ICD-10-CM | POA: Insufficient documentation

## 2018-08-04 DIAGNOSIS — Z91018 Allergy to other foods: Secondary | ICD-10-CM | POA: Diagnosis not present

## 2018-08-04 DIAGNOSIS — R634 Abnormal weight loss: Secondary | ICD-10-CM

## 2018-08-04 DIAGNOSIS — F329 Major depressive disorder, single episode, unspecified: Secondary | ICD-10-CM | POA: Diagnosis not present

## 2018-08-04 DIAGNOSIS — Z9049 Acquired absence of other specified parts of digestive tract: Secondary | ICD-10-CM | POA: Insufficient documentation

## 2018-08-04 DIAGNOSIS — Z6829 Body mass index (BMI) 29.0-29.9, adult: Secondary | ICD-10-CM | POA: Insufficient documentation

## 2018-08-04 DIAGNOSIS — R079 Chest pain, unspecified: Secondary | ICD-10-CM | POA: Diagnosis present

## 2018-08-04 HISTORY — DX: Obstructive sleep apnea (adult) (pediatric): G47.33

## 2018-08-04 HISTORY — DX: Chronic sinusitis, unspecified: J32.9

## 2018-08-04 HISTORY — DX: Pneumonia, unspecified organism: J18.9

## 2018-08-04 HISTORY — DX: Migraine, unspecified, not intractable, without status migrainosus: G43.909

## 2018-08-04 HISTORY — DX: Dependence on other enabling machines and devices: Z99.89

## 2018-08-04 HISTORY — DX: Type 2 diabetes mellitus without complications: E11.9

## 2018-08-04 LAB — BASIC METABOLIC PANEL
Anion gap: 11 (ref 5–15)
BUN: 26 mg/dL — ABNORMAL HIGH (ref 6–20)
CO2: 19 mmol/L — ABNORMAL LOW (ref 22–32)
Calcium: 9.7 mg/dL (ref 8.9–10.3)
Chloride: 104 mmol/L (ref 98–111)
Creatinine, Ser: 0.88 mg/dL (ref 0.44–1.00)
GFR calc Af Amer: >60 mL/min (ref >60)
GFR calc non Af Amer: >60 mL/min (ref >60)
Glucose, Bld: 228 mg/dL — ABNORMAL HIGH (ref 70–99)
Potassium: 4.7 mmol/L (ref 3.5–5.1)
Sodium: 134 mmol/L — ABNORMAL LOW (ref 135–145)

## 2018-08-04 LAB — GLUCOSE, CAPILLARY: Glucose-Capillary: 241 mg/dL — ABNORMAL HIGH (ref 70–99)

## 2018-08-04 LAB — CBC
HCT: 43.8 % (ref 36.0–46.0)
Hemoglobin: 14.3 g/dL (ref 12.0–15.0)
MCH: 30.8 pg (ref 26.0–34.0)
MCHC: 32.6 g/dL (ref 30.0–36.0)
MCV: 94.2 fL (ref 78.0–100.0)
Platelets: 313 10*3/uL (ref 150–400)
RBC: 4.65 MIL/uL (ref 3.87–5.11)
RDW: 12.3 % (ref 11.5–15.5)
WBC: 13.1 10*3/uL — ABNORMAL HIGH (ref 4.0–10.5)

## 2018-08-04 LAB — I-STAT TROPONIN, ED
Troponin i, poc: 0 ng/mL (ref 0.00–0.08)
Troponin i, poc: 0 ng/mL (ref 0.00–0.08)

## 2018-08-04 LAB — D-DIMER, QUANTITATIVE: D-Dimer, Quant: <0.27 ug/mL-FEU (ref 0.00–0.50)

## 2018-08-04 LAB — TROPONIN I: Troponin I: <0.03 ng/mL (ref <0.03)

## 2018-08-04 MED ORDER — ENSURE ENLIVE PO LIQD
237.0000 mL | Freq: Two times a day (BID) | ORAL | Status: DC
  Filled 2018-08-04 (×2): qty 237

## 2018-08-04 MED ORDER — LISINOPRIL 20 MG PO TABS
20.0000 mg | ORAL_TABLET | Freq: Every day | ORAL | Status: DC
  Administered 2018-08-05: 20 mg via ORAL
  Filled 2018-08-04: qty 1

## 2018-08-04 MED ORDER — ASPIRIN 81 MG PO CHEW
324.0000 mg | CHEWABLE_TABLET | Freq: Once | ORAL | Status: AC
  Administered 2018-08-04: 324 mg via ORAL
  Filled 2018-08-04: qty 4

## 2018-08-04 MED ORDER — ASPIRIN 325 MG PO TABS
325.0000 mg | ORAL_TABLET | Freq: Every day | ORAL | Status: DC
  Administered 2018-08-05: 325 mg via ORAL
  Filled 2018-08-04: qty 1

## 2018-08-04 MED ORDER — ACETAMINOPHEN 325 MG PO TABS: 650.0000 mg | ORAL_TABLET | Freq: Four times a day (QID) | ORAL | Status: DC | PRN

## 2018-08-04 MED ORDER — SODIUM CHLORIDE 0.9 % IV SOLN: 250.0000 mL | INTRAVENOUS | Status: DC | PRN

## 2018-08-04 MED ORDER — INSULIN ASPART 100 UNIT/ML ~~LOC~~ SOLN
0.0000 [IU] | Freq: Three times a day (TID) | SUBCUTANEOUS | Status: DC
  Administered 2018-08-05: 5 [IU] via SUBCUTANEOUS
  Filled 2018-08-04: qty 1

## 2018-08-04 MED ORDER — ALBUTEROL SULFATE (2.5 MG/3ML) 0.083% IN NEBU: 2.5000 mg | INHALATION_SOLUTION | RESPIRATORY_TRACT | Status: DC | PRN

## 2018-08-04 MED ORDER — ACETAMINOPHEN 650 MG RE SUPP: 650.0000 mg | Freq: Four times a day (QID) | RECTAL | Status: DC | PRN

## 2018-08-04 MED ORDER — INSULIN GLARGINE 100 UNIT/ML ~~LOC~~ SOLN
25.0000 [IU] | Freq: Every day | SUBCUTANEOUS | Status: DC
  Administered 2018-08-04: 25 [IU] via SUBCUTANEOUS
  Filled 2018-08-04: qty 0.25

## 2018-08-04 MED ORDER — SODIUM CHLORIDE 0.9% FLUSH: 3.0000 mL | Freq: Two times a day (BID) | INTRAVENOUS | Status: DC

## 2018-08-04 MED ORDER — SODIUM CHLORIDE 0.9% FLUSH
3.0000 mL | Freq: Two times a day (BID) | INTRAVENOUS | Status: DC
  Administered 2018-08-04: 3 mL via INTRAVENOUS

## 2018-08-04 MED ORDER — INSULIN ASPART 100 UNIT/ML ~~LOC~~ SOLN
0.0000 [IU] | Freq: Every day | SUBCUTANEOUS | Status: DC
  Administered 2018-08-04: 2 [IU] via SUBCUTANEOUS
  Filled 2018-08-04: qty 1

## 2018-08-04 MED ORDER — GABAPENTIN 300 MG PO CAPS
900.0000 mg | ORAL_CAPSULE | Freq: Every day | ORAL | Status: DC
  Administered 2018-08-04: 900 mg via ORAL
  Filled 2018-08-04: qty 3

## 2018-08-04 MED ORDER — PANTOPRAZOLE SODIUM 40 MG PO TBEC
40.0000 mg | DELAYED_RELEASE_TABLET | Freq: Every day | ORAL | Status: DC
  Administered 2018-08-05: 40 mg via ORAL
  Filled 2018-08-04: qty 1

## 2018-08-04 MED ORDER — NITROGLYCERIN 0.4 MG SL SUBL: 0.4000 mg | SUBLINGUAL_TABLET | SUBLINGUAL | Status: DC | PRN

## 2018-08-04 MED ORDER — ENOXAPARIN SODIUM 40 MG/0.4ML ~~LOC~~ SOLN
40.0000 mg | SUBCUTANEOUS | Status: DC
  Administered 2018-08-04: 40 mg via SUBCUTANEOUS
  Filled 2018-08-04: qty 0.4

## 2018-08-04 MED ORDER — INSULIN ASPART 100 UNIT/ML ~~LOC~~ SOLN: 3.0000 [IU] | Freq: Three times a day (TID) | SUBCUTANEOUS | Status: DC

## 2018-08-04 MED ORDER — SODIUM CHLORIDE 0.9% FLUSH: 3.0000 mL | INTRAVENOUS | Status: DC | PRN

## 2018-08-04 NOTE — ED Provider Notes (Signed)
[facility] EMERGENCY DEPARTMENT Provider Note   CSN: [id number] Arrival date & time: 08/04/18  4098     History   Chief Complaint Chief Complaint  Patient presents with  . Chest Pain    HPI Heather Sloan is a 52 y.o. female with history of diabetes, hypertension and hyperlipidemia presenting for chest pain and shortness of breath.  Patient states that symptoms began yesterday morning around 10 AM.  She describes her chest pain as a dull pressure in the center of her chest that is moderate in severity and constant.  Patient states that pain is worsened with deep breaths and feels that she cannot take a full breath.  She denies alleviating factors for her pain.  Patient states that she works in a [employer] and believes that working yesterday also worsened her symptoms.  She endorses new nonproductive cough that began this morning.  Patient also endorses nausea without vomiting.  Patient states that she has never had pain like this before.  Patient denies history of fever, vomiting, diarrhea, abdominal pain, headache or syncope.  On examination patient is resting comfortably however tearful.  Afebrile, not tachycardic, not hypotensive, SPO2 99% on room air.  Patient with significant family history including sister with myocardial infarction at age 7 and father with myocardial infarction. HPI  Past Medical History:  Diagnosis Date  . Arthritis   . Depression   . Diabetes mellitus without complication (HCC)   . Fatty liver disease, nonalcoholic    [place] 2013 [place], Hepatitis Labs negative  . GERD (gastroesophageal reflux disease)    EGD March 2018, neg H pylori  . Hyperlipidemia   . Hypertension   . OSA (obstructive sleep apnea)   . PVC (premature ventricular contraction)    Holter Monitor  2015    Patient Active Problem List   Diagnosis Date Noted  . Chest pain 08/04/2018  . Onychomycosis 01/13/2018  . Type 2 diabetes mellitus without complications (HCC)  10/13/2017  . Hypertension 10/13/2017  . Hyperlipidemia associated with type 2 diabetes mellitus (HCC) 10/13/2017  . OSA (obstructive sleep apnea) 10/13/2017  . Chronic foot pain, right 10/13/2017  . Chronic hip pain 10/13/2017  . Major depression 10/13/2017  . Tobacco use 10/13/2017  . Obesity (BMI 30-39.9) 10/13/2017  . Ventricular premature depolarization 12/04/2014  . Posterior tibial tendon dysfunction (PTTD) of right lower extremity 04/23/2013  . Hyperlipidemia 01/07/2011  . Gastroesophageal reflux disease 01/07/2011  . Cervical dysplasia 01/07/2011    Past Surgical History:  Procedure Laterality Date  . CHOLECYSTECTOMY       OB History   None      Home Medications    Prior to Admission medications   Medication Sig Start Date End Date Taking? Authorizing Provider  aspirin 81 MG chewable tablet Chew 81 mg by mouth daily.    Yes [provider]  ciclopirox (PENLAC) 8 % solution Apply topically at bedtime. Apply over nail and skin daily over previous coat. After 7 days remove with alcohol and continue cycle. Patient taking differently: Apply 1 application topically See admin instructions. Apply over nail and skin daily over previous coat. After 7 days, remove with alcohol and continue cycle, as needed for intermittent fungal infections 02/07/18  Yes [place], Velna Hatchet, MD  diclofenac (VOLTAREN) 75 MG EC tablet Take 1 tablet (75 mg total) by mouth 2 (two) times daily. 02/07/18  Yes , Velna Hatchet, MD  gabapentin (NEURONTIN) 300 MG capsule Take 1 capsule (300 mg total) by mouth  3 (three) times daily. Patient taking differently: Take 900 mg by mouth at bedtime.  03/22/18  Yes [place], Velna Hatchet, MD  insulin aspart (NOVOLOG) 100 UNIT/ML injection Inject 5 Units into the skin 3 (three) times daily before meals. 02/07/18  Yes [place], Velna Hatchet, MD  insulin degludec (TRESIBA FLEXTOUCH) 100 UNIT/ML SOPN FlexTouch Pen Inject 0.25 mLs (25 Units total) into the skin daily at 10 pm.  02/07/18  Yes [place], Velna Hatchet, MD  lisinopril (PRINIVIL,ZESTRIL) 20 MG tablet Take 1 tablet (20 mg total) by mouth daily. 02/10/18  Yes [place], Velna Hatchet, MD  metFORMIN (GLUCOPHAGE) 1000 MG tablet Take 1 tablet (1,000 mg total) by mouth 2 (two) times daily with a meal. 03/20/18  Yes [place], Velna Hatchet, MD  omeprazole (PRILOSEC) 40 MG capsule Take 1 capsule (40 mg total) by mouth daily. 02/07/18  Yes [place], Velna Hatchet, MD  amoxicillin-clavulanate (AUGMENTIN) 875-125 MG tablet Take 1 tablet by mouth 2 (two) times daily. Patient not taking: Reported on 08/04/2018 04/12/18   Danelle Berry, PA-C  montelukast (SINGULAIR) 10 MG tablet Take 1 tablet (10 mg total) by mouth at bedtime. Patient not taking: Reported on 08/04/2018 04/12/18   Danelle Berry, PA-C  simvastatin (ZOCOR) 40 MG tablet Take 1 tablet (40 mg total) by mouth daily at 6 PM. Patient not taking: Reported on 08/04/2018 11/17/17   Salley Scarlet, MD    Family History Family History  Problem Relation Age of Onset  . Diabetes Mother   . Diabetes Father   . Cancer Father        Abdominal stromal tumor   . Heart disease Father   . Heart disease Sister        HEART ATTACK  . Diabetes Maternal Grandmother   . Cancer Paternal Grandfather        Prostate/Colon Cancer    Social History Social History   Tobacco Use  . Smoking status: Current Every Day Smoker    Packs/day: 0.50    Types: Cigarettes    Start date: 10/13/1984  . Smokeless tobacco: Never Used  Substance Use Topics  . Alcohol use: Not on file  . Drug use: No     Allergies   Codeine; Bactrim [sulfamethoxazole-trimethoprim]; and Wheat extract   Review of Systems Review of Systems  Constitutional: Negative.  Negative for chills and fever.  HENT: Negative.  Negative for rhinorrhea and sore throat.   Eyes: Negative.  Negative for visual disturbance.  Respiratory: Positive for cough and shortness of breath.        No hemoptysis  Cardiovascular: Positive for chest pain.  Negative for leg swelling.  Gastrointestinal: Positive for nausea. Negative for abdominal pain, blood in stool, diarrhea and vomiting.  Genitourinary: Negative.  Negative for dysuria and hematuria.  Musculoskeletal: Negative.  Negative for arthralgias and myalgias.  Skin: Negative.  Negative for rash.  Neurological: Negative.  Negative for dizziness, syncope, weakness and headaches.    Physical Exam Updated Vital Signs BP 127/79   Pulse 68   Temp 98.3 F (36.8 C) (Oral)   Resp 15   Ht 5' 5.5" (1.664 m)   Wt 81.6 kg   LMP 09/02/2011 (Approximate)   SpO2 95%   BMI 29.50 kg/m   Physical Exam  Constitutional: She is oriented to person, place, and time. She appears well-developed and well-nourished. She does not appear ill.  HENT:  Head: Normocephalic and atraumatic.  Right Ear: External ear normal.  Left Ear: External ear normal.  Nose: Nose normal.  Eyes: Pupils are equal, round, and reactive to light. EOM are normal.  Neck: Trachea normal, normal range of motion, full passive range of motion without pain and phonation normal. Neck supple. No tracheal deviation present.  Cardiovascular: Normal rate, regular rhythm, normal heart sounds and intact distal pulses.  Pulses:      Radial pulses are 2+ on the right side, and 2+ on the left side.       Dorsalis pedis pulses are 2+ on the right side, and 2+ on the left side.       Posterior tibial pulses are 2+ on the right side, and 2+ on the left side.  Pulmonary/Chest: Effort normal and breath sounds normal. No accessory muscle usage. No respiratory distress. She has no decreased breath sounds. She exhibits tenderness. She exhibits no deformity and no swelling.  Patient with tenderness to palpation of the sternum.  Abdominal: Soft. Bowel sounds are normal. There is no tenderness. There is no rigidity, no rebound and no guarding.  Musculoskeletal: Normal range of motion.       Right lower leg: Normal. She exhibits no tenderness and no  edema.       Left lower leg: Normal. She exhibits no tenderness and no edema.  Neurological: She is alert and oriented to person, place, and time. GCS eye subscore is 4. GCS verbal subscore is 5. GCS motor subscore is 6.  Speech is clear and goal oriented, follows commands Major Cranial nerves without deficit, no facial droop Normal strength in upper and lower extremities bilaterally including dorsiflexion and plantar flexion, strong and equal grip strength Sensation normal to light and sharp touch Moves extremities without ataxia, coordination intact  Skin: Skin is warm and dry. Capillary refill takes less than 2 seconds.  Psychiatric: She has a normal mood and affect. Her behavior is normal.   ED Treatments / Results  Labs (all labs ordered are listed, but only abnormal results are displayed) Labs Reviewed  BASIC METABOLIC PANEL - Abnormal; Notable for the following components:      Result Value   Sodium 134 (*)    CO2 19 (*)    Glucose, Bld 228 (*)    BUN 26 (*)    All other components within normal limits  CBC - Abnormal; Notable for the following components:   WBC 13.1 (*)    All other components within normal limits  D-DIMER, QUANTITATIVE (NOT AT [facility])  I-STAT TROPONIN, ED  I-STAT TROPONIN, ED    EKG EKG Interpretation  Date/Time:  Friday August 04 2018 09:22:33 EDT Ventricular Rate:  75 PR Interval:  140 QRS Duration: 88 QT Interval:  408 QTC Calculation: 455 R Axis:   51 Text Interpretation:  Normal sinus rhythm Septal infarct , age undetermined Abnormal ECG No old tracing to compare Confirmed by Pricilla Loveless [phone]) on 08/04/2018 12:23:37 PM   Radiology Dg Chest 2 View  Result Date: 08/04/2018 CLINICAL DATA:  Chest pain. EXAM: CHEST - 2 VIEW COMPARISON:  None. FINDINGS: The heart size and mediastinal contours are within normal limits. Both lungs are clear. No pneumothorax or pleural effusion is noted. The visualized skeletal structures are unremarkable.  IMPRESSION: No active cardiopulmonary disease. Electronically Signed   By: Lupita Raider, M.D.   On: 08/04/2018 09:56    Procedures Procedures (including critical care time)  Medications Ordered in ED Medications  aspirin chewable tablet 324 mg (324 mg Oral Given 08/04/18 1538)     Initial Impression / Assessment and Plan /  ED Course  I have reviewed the triage vital signs and the nursing notes.  Pertinent labs & imaging results that were available during my care of the patient were reviewed by me and considered in my medical decision making (see chart for details).  Clinical Course as of Aug 04 1650  Fri Aug 04, 2018  1631 With hospitalist who is admitting patient to telemetry for cardiac rule out.   [BM]    Clinical Course User Index [BM] Bill Salinas, PA-C   Patient with chest pain that started yesterday.  Pleuritic and constant.  Aspirin given.  Troponin negative x2 D-dimer negative CBC with white blood cell count of 13.1 BMP with elevated glucose  Chest x-ray negative EKG without acute abnormality reviewed by Dr. Criss Alvine  Heart score (4): +1 history, +1 age, +16 risk factors  Consult called to Dr. Waymon Amato who is admitting patient for cardiac rule out.  Patient has been seen and evaluated by Dr. Criss Alvine who agrees with admission.  On admission patient afebrile, not tachycardic, not hypotensive, resting comfortably in no acute distress.  Patient is agreeable with admission at this time.   Note: Portions of this report may have been transcribed using voice recognition software. Every effort was made to ensure accuracy; however, inadvertent computerized transcription errors may still be present.  Final Clinical Impressions(s) / ED Diagnoses   Final diagnoses:  Chest pain, unspecified type    ED Discharge Orders    None       Bill Salinas, PA-C 08/04/18 1653    Pricilla Loveless, MD 08/05/18 1213

## 2018-08-04 NOTE — ED Notes (Signed)
Patient already in room

## 2018-08-04 NOTE — Consult Note (Signed)
Cardiology Consultation:   Patient ID: Heather Sloan MRN: [id number]; DOB: 1966/06/01  Admit date: 08/04/2018 Date of Consult: 08/04/2018  Primary Care Provider: Salley Scarlet, MD Primary Cardiologist:  None Primary Electrophysiologist:  None     Patient Profile:   Heather Sloan is a 52 y.o. female with a hx of HLD, HTN, smoker  and family history CAD who is being seen today for the evaluation of chest pain  at the request of Dr Waymon Amato .  History of Present Illness:   Heather Sloan 52 y.o. HTN, HLD smoker 48 hours of SSCP dull pressure in center of chest worse with deep breath and some associated dyspnea. Worse at work in [employer] in [place] during heat. No associated palpitations , syncope cough sputum or fever Pain free in ER but teary eyed. ECG is normal POC troponin negative and CXR normal. She is originally from [place] Also has had some skin lesions on knuckles last few months has never had heart issues stress testing Husband has stents and smokes as well   Past Medical History:  Diagnosis Date  . Arthritis   . Depression   . Diabetes mellitus without complication (HCC)   . Fatty liver disease, nonalcoholic    [place] 2013 [place], Hepatitis Labs negative  . GERD (gastroesophageal reflux disease)    EGD March 2018, neg H pylori  . Hyperlipidemia   . Hypertension   . OSA (obstructive sleep apnea)   . PVC (premature ventricular contraction)    Holter Monitor  2015    Past Surgical History:  Procedure Laterality Date  . CHOLECYSTECTOMY       Home Medications:  Prior to Admission medications   Medication Sig Start Date End Date Taking? Authorizing Provider  aspirin 81 MG chewable tablet Chew 81 mg by mouth daily.    Yes [provider]  ciclopirox (PENLAC) 8 % solution Apply topically at bedtime. Apply over nail and skin daily over previous coat. After 7 days remove with alcohol and continue cycle. Patient taking differently: Apply 1 application topically See admin  instructions. Apply over nail and skin daily over previous coat. After 7 days, remove with alcohol and continue cycle, as needed for intermittent fungal infections 02/07/18  Yes [place], Velna Hatchet, MD  diclofenac (VOLTAREN) 75 MG EC tablet Take 1 tablet (75 mg total) by mouth 2 (two) times daily. 02/07/18  Yes [place], Velna Hatchet, MD  gabapentin (NEURONTIN) 300 MG capsule Take 1 capsule (300 mg total) by mouth 3 (three) times daily. Patient taking differently: Take 900 mg by mouth at bedtime.  03/22/18  Yes [place], Velna Hatchet, MD  insulin aspart (NOVOLOG) 100 UNIT/ML injection Inject 5 Units into the skin 3 (three) times daily before meals. 02/07/18  Yes [place], Velna Hatchet, MD  insulin degludec (TRESIBA FLEXTOUCH) 100 UNIT/ML SOPN FlexTouch Pen Inject 0.25 mLs (25 Units total) into the skin daily at 10 pm. 02/07/18  Yes [place], Velna Hatchet, MD  lisinopril (PRINIVIL,ZESTRIL) 20 MG tablet Take 1 tablet (20 mg total) by mouth daily. 02/10/18  Yes [place], Velna Hatchet, MD  metFORMIN (GLUCOPHAGE) 1000 MG tablet Take 1 tablet (1,000 mg total) by mouth 2 (two) times daily with a meal. 03/20/18  Yes [place], Velna Hatchet, MD  omeprazole (PRILOSEC) 40 MG capsule Take 1 capsule (40 mg total) by mouth daily. 02/07/18  Yes [place], Velna Hatchet, MD  simvastatin (ZOCOR) 40 MG tablet Take 1 tablet (40 mg total) by mouth daily at 6 PM. Patient not taking: Reported on 08/04/2018  11/17/17   Salley Scarlet, MD    Inpatient Medications: Scheduled Meds:  Continuous Infusions:  PRN Meds:   Allergies:    Allergies  Allergen Reactions  . Codeine Other (See Comments)    Passed out  . Bactrim [Sulfamethoxazole-Trimethoprim] Hives  . Wheat Extract Diarrhea and Other (See Comments)    Irritable bowel, bloating    Social History:   Social History   Socioeconomic History  . Marital status: Married    Spouse name: Not on file  . Number of children: Not on file  . Years of education: Not on file  . Highest education level: Not on file    Occupational History  . Not on file  Social Needs  . Financial resource strain: Not on file  . Food insecurity:    Worry: Not on file    Inability: Not on file  . Transportation needs:    Medical: Not on file    Non-medical: Not on file  Tobacco Use  . Smoking status: Current Every Day Smoker    Packs/day: 0.50    Types: Cigarettes    Start date: 10/13/1984  . Smokeless tobacco: Never Used  Substance and Sexual Activity  . Alcohol use: Not Currently  . Drug use: No  . Sexual activity: Yes  Lifestyle  . Physical activity:    Days per week: Not on file    Minutes per session: Not on file  . Stress: Not on file  Relationships  . Social connections:    Talks on phone: Not on file    Gets together: Not on file    Attends religious service: Not on file    Active member of club or organization: Not on file    Attends meetings of clubs or organizations: Not on file    Relationship status: Not on file  . Intimate partner violence:    Fear of current or ex partner: Not on file    Emotionally abused: Not on file    Physically abused: Not on file    Forced sexual activity: Not on file  Other Topics Concern  . Not on file  Social History Narrative  . Not on file    Family History:    Family History  Problem Relation Age of Onset  . Diabetes Mother   . Diabetes Father   . Cancer Father        Abdominal stromal tumor   . Heart disease Father   . Heart disease Sister        HEART ATTACK  . Diabetes Maternal Grandmother   . Cancer Paternal Grandfather        Prostate/Colon Cancer     ROS:  Please see the history of present illness.   All other ROS reviewed and negative.     Physical Exam/Data:   Vitals:   08/04/18 1615 08/04/18 1630 08/04/18 1645 08/04/18 1700  BP: 124/73 127/79 130/87 (!) 123/94  Pulse: 65 68 67 67  Resp: 20 15  12   Temp:      TempSrc:      SpO2: 96% 95% 100% 98%  Weight:      Height:       No intake or output data in the 24 hours ending  08/04/18 1725 Filed Weights   08/04/18 1204  Weight: 81.6 kg   Body mass index is 29.5 kg/m.  General:  Well nourished, well developed, in no acute distress  HEENT: normal Lymph: no adenopathy Neck: no JVD Endocrine:  No thryomegaly Vascular: No carotid bruits; FA pulses 2+ bilaterally without bruits  Cardiac:  normal S1, S2; RRR; no murmur   Lungs:  clear to auscultation bilaterally, no wheezing, rhonchi or rales  Abd: soft, nontender, no hepatomegaly  Ext: no edema Musculoskeletal:  No deformities, BUE and BLE strength normal and equal Skin: ? tophaceioius lesions on knuckles  Neuro:  CNs 2-12 intact, no focal abnormalities noted Psych:  Normal affect   EKG:  The EKG was personally reviewed and demonstrates:  NSR normal ECG  Telemetry:  Telemetry was personally reviewed and demonstrates:  NSR no arrhythmia   Relevant CV Studies: None   Laboratory Data:  Chemistry Recent Labs  Lab 08/04/18 0925  NA 134*  K 4.7  CL 104  CO2 19*  GLUCOSE 228*  BUN 26*  CREATININE 0.88  CALCIUM 9.7  GFRNONAA >60  GFRAA >60  ANIONGAP 11    No results for input(s): PROT, ALBUMIN, AST, ALT, ALKPHOS, BILITOT in the last 168 hours. Hematology Recent Labs  Lab 08/04/18 0925  WBC 13.1*  RBC 4.65  HGB 14.3  HCT 43.8  MCV 94.2  MCH 30.8  MCHC 32.6  RDW 12.3  PLT 313   Cardiac EnzymesNo results for input(s): TROPONINI in the last 168 hours.  Recent Labs  Lab 08/04/18 0948 08/04/18 1407  TROPIPOC 0.00 0.00    BNPNo results for input(s): BNP, PROBNP in the last 168 hours.  DDimer  Recent Labs  Lab 08/04/18 1429  DDIMER <0.27    Radiology/Studies:  Dg Chest 2 View  Result Date: 08/04/2018 CLINICAL DATA:  Chest pain. EXAM: CHEST - 2 VIEW COMPARISON:  None. FINDINGS: The heart size and mediastinal contours are within normal limits. Both lungs are clear. No pneumothorax or pleural effusion is noted. The visualized skeletal structures are unremarkable. IMPRESSION: No active  cardiopulmonary disease. Electronically Signed   By: Lupita Raider, M.D.   On: 08/04/2018 09:56    Assessment and Plan:   1. Chest Pain: Atypical normal ECG and exam. CXR normal mediastinum she has a plate in her  Left foot and previous fracture but thinks she can walk on treadmill Will order ETT in am Trend troponins and ECG in am  2. Smoking:  CXR ok counseled on cessation husband smokes despite CAD 3. Dermatology not clear what lesions on knuckles are may be tophaceous doubt from cholesterol Do not appear like pustules per primary team 4. HLD continue zocor       For questions or updates, please contact [facility] Please consult [email or web address] for contact info under     Signed, Charlton Haws, MD  08/04/2018 5:25 PM

## 2018-08-04 NOTE — ED Notes (Signed)
Admitting at bedside 

## 2018-08-04 NOTE — H&P (Addendum)
**Note Heather-Identified via Obfuscation** History and Physical    Heather Sloan ZOX:[id number] DOB: 05-Jul-1966 DOA: 08/04/2018  PCP: Heather Scarlet, MD   I have briefly reviewed patients previous medical reports in [facility].  Patient coming from: Home  Chief Complaint: Chest discomfort  HPI: Heather Sloan is a pleasant 52 year old married female, originally from [place], currently living and working in [place], works as a Location manager at [employer] where she at times has to do heavy lifting and moving, PMH of long-standing DM 2, HTN, HLD, OSA on CPAP, ongoing tobacco abuse, GERD, presented to [facility] ED on 08/04/2018 due to chest discomfort.  On 08/03/2018, while at work after lunch when she was leaning over to do something, she first noted central chest discomfort, this was reported as pressure-like, 5/10 in severity, unable to say if it radiated, associated with dyspnea.  She indicates that her work environment is always extremely hot and sweaty.  The discomfort continued for approximately 4 hours until she went home, had a shower and relaxed following which it subsided but did not completely resolved.  Her sleep overnight was disturbed due to chest discomfort and lower extremity cramps.  She had no chest pain when she woke up but had mild nausea, the chest discomfort then recurred at work and progressively got worse with associated dyspnea.  She then called her husband who brought her to the ED.  She feels that her chest discomfort is more with activity and relieved by rest, unable to say if it is worse with movement of her chest wall or on taking deep breaths.  Did state that her pain got slightly worse after eating something in the ED.  Indicates that this discomfort is entirely different from her reflux symptoms.  She denies prior such symptoms.  She denies history of having had a stress test or cardiac cath done.  Strong family history of CAD with her sister having heart attack in her 27s and "bottom part of her heart is  dead", father who had coronary stents placed in his early 46s.  Currently chest discomfort has significantly improved, minimal.  ED Course: Lab work significant for WBC 13, glucose 228, POC troponin x2 negative, d-dimer negative, EKG without acute findings, chest x-ray without acute findings.  Review of Systems:  2 days PTA, patient reported transient visual abnormalities while at work when she was looking at screen when she felt glimmering vision but no other strokelike symptoms.  This lasted about 30 minutes and resolved spontaneously without recurrence.  She also reports involuntary 23 pound weight loss over the last 2 months.  She denies GI symptoms such as nausea, vomiting, decreased appetite, change in bowel habits or abdominal pain.  Reports that her appetite is good.  Had colonoscopy when she turned 50 in [place] which was reportedly normal.  Has also noted bumps on her knuckles which are painful and not related to her work.  All other systems reviewed and apart from HPI, are negative.  Past Medical History:  Diagnosis Date  . Arthritis   . Depression   . Diabetes mellitus without complication (HCC)   . Fatty liver disease, nonalcoholic    [place] 2013 [place], Hepatitis Labs negative  . GERD (gastroesophageal reflux disease)    EGD March 2018, neg H pylori  . Hyperlipidemia   . Hypertension   . OSA (obstructive sleep apnea)   . PVC (premature ventricular contraction)    Holter Monitor  2015    Past Surgical History:  Procedure Laterality  Date  . CHOLECYSTECTOMY      Social History  reports that she has been smoking cigarettes. She started smoking about 33 years ago. She has been smoking about 0.50 packs per day. She has never used smokeless tobacco. She reports that she drank alcohol. She reports that she does not use drugs.  Allergies  Allergen Reactions  . Codeine Other (See Comments)    Passed out  . Bactrim [Sulfamethoxazole-Trimethoprim] Hives  . Wheat Extract Diarrhea  and Other (See Comments)    Irritable bowel, bloating    Family History  Problem Relation Age of Onset  . Diabetes Mother   . Diabetes Father   . Cancer Father        Abdominal stromal tumor   . Heart disease Father   . Heart disease Sister        HEART ATTACK  . Diabetes Maternal Grandmother   . Cancer Paternal Grandfather        Prostate/Colon Cancer     Prior to Admission medications   Medication Sig Start Date End Date Taking? Authorizing Provider  aspirin 81 MG chewable tablet Chew 81 mg by mouth daily.    Yes [provider]  ciclopirox (PENLAC) 8 % solution Apply topically at bedtime. Apply over nail and skin daily over previous coat. After 7 days remove with alcohol and continue cycle. Patient taking differently: Apply 1 application topically See admin instructions. Apply over nail and skin daily over previous coat. After 7 days, remove with alcohol and continue cycle, as needed for intermittent fungal infections 02/07/18  Yes Heather Sloan, Heather Hatchet, MD  diclofenac (VOLTAREN) 75 MG EC tablet Take 1 tablet (75 mg total) by mouth 2 (two) times daily. 02/07/18  Yes Heather Sloan, Heather Hatchet, MD  gabapentin (NEURONTIN) 300 MG capsule Take 1 capsule (300 mg total) by mouth 3 (three) times daily. Patient taking differently: Take 900 mg by mouth at bedtime.  03/22/18  Yes Heather Sloan, Heather Hatchet, MD  insulin aspart (NOVOLOG) 100 UNIT/ML injection Inject 5 Units into the skin 3 (three) times daily before meals. 02/07/18  Yes Heather Sloan, Heather Hatchet, MD  insulin degludec (TRESIBA FLEXTOUCH) 100 UNIT/ML SOPN FlexTouch Pen Inject 0.25 mLs (25 Units total) into the skin daily at 10 pm. 02/07/18  Yes Heather Sloan, Heather Hatchet, MD  lisinopril (PRINIVIL,ZESTRIL) 20 MG tablet Take 1 tablet (20 mg total) by mouth daily. 02/10/18  Yes Heather Sloan, Heather Hatchet, MD  metFORMIN (GLUCOPHAGE) 1000 MG tablet Take 1 tablet (1,000 mg total) by mouth 2 (two) times daily with a meal. 03/20/18  Yes Heather Sloan, Heather Hatchet, MD  omeprazole (PRILOSEC) 40 MG  capsule Take 1 capsule (40 mg total) by mouth daily. 02/07/18  Yes Heather Sloan, Heather Hatchet, MD  simvastatin (ZOCOR) 40 MG tablet Take 1 tablet (40 mg total) by mouth daily at 6 PM. Patient not taking: Reported on 08/04/2018 11/17/17   Heather Scarlet, MD    Physical Exam: Vitals:   08/04/18 1615 08/04/18 1630 08/04/18 1645 08/04/18 1700  BP: 124/73 127/79 130/87 (!) 123/94  Pulse: 65 68 67 67  Resp: 20 15  12   Temp:      TempSrc:      SpO2: 96% 95% 100% 98%  Weight:      Height:          Constitutional: Pleasant young female, moderately built and overweight, lying comfortably propped up in bed. Eyes: PERTLA, lids and conjunctivae normal ENMT: Mucous membranes are moist. Posterior pharynx clear of any exudate or lesions.  Normal dentition.  Neck: supple, no masses, no thyromegaly Respiratory: clear to auscultation bilaterally, no wheezing, no crackles. Normal respiratory effort. No accessory muscle use.??  Reproducible precordial chest wall tenderness. Cardiovascular: S1 & S2 heard, regular rate and rhythm, no murmurs / rubs / gallops. No extremity edema. 2+ pedal pulses. No carotid bruits.  Telemetry personally reviewed: Sinus rhythm. Abdomen: No distension but obese, no tenderness, no masses palpated. No hepatosplenomegaly. Bowel sounds normal.  Musculoskeletal: no clubbing / cyanosis. No joint deformity upper and lower extremities. Good ROM, no contractures. Normal muscle tone.  Skin: no rashes, ulcers. No induration.  Patient has tiny nodular lesions on her knuckles/metacarpophalangeal joints of both upper extremities, right more than the left of unclear etiology. Neurologic: CN 2-12 grossly intact. Sensation intact, DTR normal. Strength 5/5 in all 4 limbs.  Psychiatric: Normal judgment and insight. Alert and oriented x 3.  Appeared slightly anxious and tearful.     Labs on Admission: I have personally reviewed following labs and imaging studies  CBC: Recent Labs  Lab 08/04/18 0925    WBC 13.1*  HGB 14.3  HCT 43.8  MCV 94.2  PLT 313   Basic Metabolic Panel: Recent Labs  Lab 08/04/18 0925  NA 134*  K 4.7  CL 104  CO2 19*  GLUCOSE 228*  BUN 26*  CREATININE 0.88  CALCIUM 9.7     Radiological Exams on Admission: Dg Chest 2 View  Result Date: 08/04/2018 CLINICAL DATA:  Chest pain. EXAM: CHEST - 2 VIEW COMPARISON:  None. FINDINGS: The heart size and mediastinal contours are within normal limits. Both lungs are clear. No pneumothorax or pleural effusion is noted. The visualized skeletal structures are unremarkable. IMPRESSION: No active cardiopulmonary disease. Electronically Signed   By: Lupita Raider, M.D.   On: 08/04/2018 09:56    EKG: Independently reviewed.  Sinus rhythm at 75 bpm, normal axis, no acute changes.  QTc 455 ms.  Assessment/Plan Principal Problem:   Chest pain Active Problems:   Type 2 diabetes mellitus without complications (HCC)   Hypertension   OSA (obstructive sleep apnea)   Tobacco use   Hyperlipidemia   Gastroesophageal reflux disease   Weight loss, unintentional     1. Chest pain: Concerning for angina.  Multiple CAD risk factors: DM, HTN, HLD, smoking, overweight, strong family history.  Rule out ACS.  Despite more than 24 hours of chest pain, EKG without acute findings and POC troponins x 2 neg which is reassuring.  D-dimer negative.  Other DD: Musculoskeletal related to heavy lifting/moving at work, GERD with esophageal spasm (appears to take NSAIDs at home) versus other etiologies.  Admit to telemetry.  Cycle troponins.  Continue aspirin 325 mg daily.  Sublingual NTG as needed.  Cardiology consulted/I discussed with Dr. Eden Emms at bedside.  Plan is for treadmill stress test 10/5.  Continue PPI. 2. Type II DM/IDDM: Hold oral hypoglycemics.  Continue prior home dose of Tresiba, add NovoLog SSI and meal coverage.  Check A1c. 3. Essential hypertension: Continue lisinopril and monitor. 4. Hyperlipidemia: Not on medications at home.   Check fasting lipids. 5. OSA on CPAP: Continue. 6. Tobacco abuse: Cessation counseled.  Declines nicotine patch stating that they cause her to have a rash. 7. Unintentional weight loss: Reports 23 pounds weight loss over 2 months.  Colonoscopy updated.  Unclear etiology.  Check TSH.  Outpatient follow-up for evaluation and management by PCP. 8. Skin lesions on her hands: Not sure if these are related to hyperlipidemia, check fasting  lipids or gout versus other etiologies.  Recommend outpatient dermatology consultation. 9. GERD: Continue PPI.  Tobacco cessation counseled.   DVT prophylaxis: Lovenox Code Status: Full Family Communication: Discussed in detail with spouse at bedside. Disposition Plan: DC home pending clinical improvement and evaluation. Consults called: Cardiology Admission status: Observation, telemetry.  Severity of Illness: The appropriate patient status for this patient is OBSERVATION. Observation status is judged to be reasonable and necessary in order to provide the required intensity of service to ensure the patient's safety. The patient's presenting symptoms, physical exam findings, and initial radiographic and laboratory data in the context of their medical condition is felt to place them at decreased risk for further clinical deterioration. Furthermore, it is anticipated that the patient will be medically stable for discharge from the hospital within 2 midnights of admission. The following factors support the patient status of observation.   " The patient's presenting symptoms include chest pain of more than 24 hours duration. " The physical exam findings include??  Reproducible chest wall tenderness. " The initial radiographic and laboratory data are troponin x2, d-dimer, chest x-ray negative.      Marcellus Scott MD [facility] Pager [phone]  If 7PM-7AM, please contact night-coverage [email or web address] Password [facility]  08/04/2018, 5:39 PM

## 2018-08-04 NOTE — ED Notes (Signed)
No answer in lobby for rooming, will call again

## 2018-08-04 NOTE — ED Notes (Signed)
Results reviewed, no changes in acuity at this time 

## 2018-08-04 NOTE — ED Triage Notes (Signed)
Pt presents for evaluation of chest pressure and sob x 1 day.

## 2018-08-05 ENCOUNTER — Other Ambulatory Visit: Payer: Self-pay | Admitting: Student

## 2018-08-05 DIAGNOSIS — Z720 Tobacco use: Secondary | ICD-10-CM | POA: Diagnosis not present

## 2018-08-05 DIAGNOSIS — E7800 Pure hypercholesterolemia, unspecified: Secondary | ICD-10-CM | POA: Diagnosis not present

## 2018-08-05 DIAGNOSIS — I10 Essential (primary) hypertension: Secondary | ICD-10-CM

## 2018-08-05 DIAGNOSIS — I208 Other forms of angina pectoris: Secondary | ICD-10-CM | POA: Diagnosis not present

## 2018-08-05 DIAGNOSIS — R079 Chest pain, unspecified: Secondary | ICD-10-CM

## 2018-08-05 LAB — LIPID PANEL
Cholesterol: 190 mg/dL (ref 0–200)
HDL: 33 mg/dL — ABNORMAL LOW (ref >40)
LDL Cholesterol: 119 mg/dL — ABNORMAL HIGH (ref 0–99)
Total CHOL/HDL Ratio: 5.8 RATIO
Triglycerides: 188 mg/dL — ABNORMAL HIGH (ref <150)
VLDL: 38 mg/dL (ref 0–40)

## 2018-08-05 LAB — TROPONIN I
Troponin I: <0.03 ng/mL (ref <0.03)
Troponin I: <0.03 ng/mL (ref <0.03)

## 2018-08-05 LAB — GLUCOSE, CAPILLARY: Glucose-Capillary: 281 mg/dL — ABNORMAL HIGH (ref 70–99)

## 2018-08-05 LAB — TSH: TSH: 3.835 u[IU]/mL (ref 0.350–4.500)

## 2018-08-05 NOTE — Progress Notes (Signed)
Progress Note  Patient Name: Heather Sloan Date of Encounter: 08/05/2018  Primary Cardiologist: Dr. Charlton Haws  Subjective   52 year old mildly overweight admitted with atypical chest pain.  She has a history of treated hypertension, diabetes, hyperlipidemia and tobacco abuse as well as family history of heart disease.  She no longer has chest pain.  Her enzymes are negative and her EKG shows no acute changes.  Inpatient Medications    Scheduled Meds: . aspirin  325 mg Oral Daily  . enoxaparin (LOVENOX) injection  40 mg Subcutaneous Q24H  . feeding supplement (ENSURE ENLIVE)  237 mL Oral BID BM  . gabapentin  900 mg Oral QHS  . insulin aspart  0-5 Units Subcutaneous QHS  . insulin aspart  0-9 Units Subcutaneous TID WC  . insulin aspart  3 Units Subcutaneous TID WC  . insulin glargine  25 Units Subcutaneous Q2200  . lisinopril  20 mg Oral Daily  . pantoprazole  40 mg Oral Daily  . sodium chloride flush  3 mL Intravenous Q12H  . sodium chloride flush  3 mL Intravenous Q12H   Continuous Infusions: . sodium chloride     PRN Meds: sodium chloride, acetaminophen **OR** acetaminophen, albuterol, nitroGLYCERIN, sodium chloride flush   Vital Signs    Vitals:   08/04/18 2227 08/04/18 2347 08/05/18 0013 08/05/18 0500  BP:  112/79  110/72  Pulse: (!) 58 60  (!) 57  Resp: 18 18  16   Temp:  (!) 96.6 F (35.9 C) (!) 96.7 F (35.9 C) 97.6 F (36.4 C)  TempSrc:  Axillary Axillary Oral  SpO2: 97% 98%  97%  Weight:      Height:       No intake or output data in the 24 hours ending 08/05/18 1051 Filed Weights   08/04/18 1204  Weight: 81.6 kg    Telemetry    Sinus rhythm- Personally Reviewed  ECG    Not performed today- Personally Reviewed  Physical Exam   GEN: No acute distress.   Neck: No JVD Cardiac: RRR, no murmurs, rubs, or gallops.  Respiratory: Clear to auscultation bilaterally. GI: Soft, nontender, non-distended  MS: No edema; No deformity. Neuro:  Nonfocal   Psych: Normal affect   Labs    Chemistry Recent Labs  Lab 08/04/18 0925  NA 134*  K 4.7  CL 104  CO2 19*  GLUCOSE 228*  BUN 26*  CREATININE 0.88  CALCIUM 9.7  GFRNONAA >60  GFRAA >60  ANIONGAP 11     Hematology Recent Labs  Lab 08/04/18 0925  WBC 13.1*  RBC 4.65  HGB 14.3  HCT 43.8  MCV 94.2  MCH 30.8  MCHC 32.6  RDW 12.3  PLT 313    Cardiac Enzymes Recent Labs  Lab 08/04/18 1947 08/05/18 0223 08/05/18 0809  TROPONINI <0.03 <0.03 <0.03    Recent Labs  Lab 08/04/18 0948 08/04/18 1407  TROPIPOC 0.00 0.00     BNPNo results for input(s): BNP, PROBNP in the last 168 hours.   DDimer  Recent Labs  Lab 08/04/18 1429  DDIMER <0.27     Radiology    Dg Chest 2 View  Result Date: 08/04/2018 CLINICAL DATA:  Chest pain. EXAM: CHEST - 2 VIEW COMPARISON:  None. FINDINGS: The heart size and mediastinal contours are within normal limits. Both lungs are clear. No pneumothorax or pleural effusion is noted. The visualized skeletal structures are unremarkable. IMPRESSION: No active cardiopulmonary disease. Electronically Signed   By: Lupita Raider, M.D.  On: 08/04/2018 09:56    Cardiac Studies   None  Patient Profile     52 y.o. female admitted with atypical chest pain.  Is currently pain-free.  Her enzymes are negative.  Her EKG shows no acute changes.  She does have risk factors including tobacco abuse, hypertension, diabetes, hyperlipidemia and family history.  Assessment & Plan    1: Atypical chest pain- positive cardiac risk factors, no longer has chest pain with negative enzymes and normal EKG.  We are unable to get a GXT today but will arrange as an outpatient early next week.  2: Essential hypertension- stable on current medications  3: Hyperlipidemia- on statin therapy  [facility] will sign off.   Medication Recommendations: No changes Other recommendations (labs, testing, etc): We will arrange outpatient GXT early next week Follow up  as an outpatient: As needed if GXT negative.  For questions or updates, please contact [facility] Please consult [email or web address] for contact info under        Signed, Nanetta Batty, MD  08/05/2018, 10:51 AM

## 2018-08-05 NOTE — Progress Notes (Signed)
D/c reviewed with patient and spouse. No further questions 

## 2018-08-05 NOTE — Discharge Summary (Signed)
Physician Discharge Summary  Heather Sloan ZOX:[id number] DOB: [birth year] DOA: 08/04/2018  PCP: Salley Scarlet, MD  Admit date: 08/04/2018 Discharge date: 08/05/2018  Admitted From: Home Disposition: Home  Recommendations for Outpatient Follow-up:  1. Follow up with PCP in 1-2 weeks 2. Please keep appointment for outpatient stress test that will be scheduled by cardiology 3. Call your doctor or come to the emergency department if you develop chest pain   Home Health: No Equipment/Devices: None  Discharge Condition: Stable CODE STATUS:*Full code Diet recommendation: Heart Healthy   Brief/Interim Summary: 52 year old mildly overweight admitted with atypical chest pain.  She has a history of treated hypertension, diabetes, hyperlipidemia and tobacco abuse as well as family history of heart disease.  She no longer has chest pain.  Her enzymes are negative and her EKG shows no acute changes.  She has been seen in consultation by cardiology who has recommended outpatient stress testing.  They will arrange this as an outpatient.   Patient has reached maximal benefit of hospitalization.  Discharge diagnosis, prognosis, plans, follow-up, medications and treatments discussed with the patient(or responsible party) and is in agreement with the plans as described.  Patient is stable for discharge.  Discharge Diagnoses:  Principal Problem:   Chest pain Active Problems:   Type 2 diabetes mellitus without complications (HCC)   Hypertension   OSA (obstructive sleep apnea)   Tobacco use   Hyperlipidemia   Gastroesophageal reflux disease   Weight loss, unintentional    Discharge Instructions  Discharge Instructions    Call MD for:  persistant dizziness or light-headedness   Complete by:  As directed    Call MD for:  severe uncontrolled pain   Complete by:  As directed    Diet - low sodium heart healthy   Complete by:  As directed    Discharge instructions   Complete by:  As directed     Cardiology will arrange an outpatient nuclear stress test.  Please keep that appointment.    Please follow-up with your primary care physician, Dr. Jeanice Lim in 1 to 2 weeks.   Increase activity slowly   Complete by:  As directed      Allergies as of 08/05/2018      Reactions   Codeine Other (See Comments)   Passed out   Bactrim [sulfamethoxazole-trimethoprim] Hives   Wheat Extract Diarrhea, Other (See Comments)   Irritable bowel, bloating      Medication List    TAKE these medications   aspirin 81 MG chewable tablet Chew 81 mg by mouth daily.   ciclopirox 8 % solution Commonly known as:  PENLAC Apply topically at bedtime. Apply over nail and skin daily over previous coat. After 7 days remove with alcohol and continue cycle. What changed:    how much to take  when to take this  additional instructions   diclofenac 75 MG EC tablet Commonly known as:  VOLTAREN Take 1 tablet (75 mg total) by mouth 2 (two) times daily.   gabapentin 300 MG capsule Commonly known as:  NEURONTIN Take 1 capsule (300 mg total) by mouth 3 (three) times daily. What changed:    how much to take  when to take this   insulin aspart 100 UNIT/ML injection Commonly known as:  novoLOG Inject 5 Units into the skin 3 (three) times daily before meals.   insulin degludec 100 UNIT/ML Sopn FlexTouch Pen Commonly known as:  TRESIBA Inject 0.25 mLs (25 Units total) into the skin daily at 10 pm.  lisinopril 20 MG tablet Commonly known as:  PRINIVIL,ZESTRIL Take 1 tablet (20 mg total) by mouth daily.   metFORMIN 1000 MG tablet Commonly known as:  GLUCOPHAGE Take 1 tablet (1,000 mg total) by mouth 2 (two) times daily with a meal.   omeprazole 40 MG capsule Commonly known as:  PRILOSEC Take 1 capsule (40 mg total) by mouth daily.   simvastatin 40 MG tablet Commonly known as:  ZOCOR Take 1 tablet (40 mg total) by mouth daily at 6 PM.      Follow-up Information    Wendall Stade, MD Follow up.    Specialty:  Cardiology Why:  The office will contact you to arrange an outpatient stress test. Contact information: [address] [place] [address] [phone]          Allergies  Allergen Reactions  . Codeine Other (See Comments)    Passed out  . Bactrim [Sulfamethoxazole-Trimethoprim] Hives  . Wheat Extract Diarrhea and Other (See Comments)    Irritable bowel, bloating    Consultations: Dr. Ranelle Oyster [facility] cardiology  Procedures/Studies: Dg Chest 2 View  Result Date: 08/04/2018 CLINICAL DATA:  Chest pain. EXAM: CHEST - 2 VIEW COMPARISON:  None. FINDINGS: The heart size and mediastinal contours are within normal limits. Both lungs are clear. No pneumothorax or pleural effusion is noted. The visualized skeletal structures are unremarkable. IMPRESSION: No active cardiopulmonary disease. Electronically Signed   By: Lupita Raider, M.D.   On: 08/04/2018 09:56       Subjective: Patient has had no further chest pain.  She is feeling well today.  Discharge Exam: Vitals:   08/05/18 0500 08/05/18 1100  BP: 110/72 (!) 141/97  Pulse: (!) 57 (!) 54  Resp: 16 14  Temp: 97.6 F (36.4 C) (!) 97.5 F (36.4 C)  SpO2: 97% 98%   Vitals:   08/04/18 2347 08/05/18 0013 08/05/18 0500 08/05/18 1100  BP: 112/79  110/72 (!) 141/97  Pulse: 60  (!) 57 (!) 54  Resp: 18  16 14   Temp: (!) 96.6 F (35.9 C) (!) 96.7 F (35.9 C) 97.6 F (36.4 C) (!) 97.5 F (36.4 C)  TempSrc: Axillary Axillary Oral Oral  SpO2: 98%  97% 98%  Weight:      Height:        General: Pt is alert, awake, not in acute distress Cardiovascular: RRR, S1/S2 +, no rubs, no gallops Respiratory: CTA bilaterally, no wheezing, no rhonchi Abdominal: Soft, NT, ND, bowel sounds + Extremities: no edema, no cyanosis    The results of significant diagnostics from this hospitalization (including imaging, microbiology, ancillary and laboratory) are listed below for reference.     Microbiology: No  results found for this or any previous visit (from the past 240 hour(s)).   Labs: BNP (last 3 results) No results for input(s): BNP in the last 8760 hours. Basic Metabolic Panel: Recent Labs  Lab 08/04/18 0925  NA 134*  K 4.7  CL 104  CO2 19*  GLUCOSE 228*  BUN 26*  CREATININE 0.88  CALCIUM 9.7   Liver Function Tests: No results for input(s): AST, ALT, ALKPHOS, BILITOT, PROT, ALBUMIN in the last 168 hours. No results for input(s): LIPASE, AMYLASE in the last 168 hours. No results for input(s): AMMONIA in the last 168 hours. CBC: Recent Labs  Lab 08/04/18 0925  WBC 13.1*  HGB 14.3  HCT 43.8  MCV 94.2  PLT 313   Cardiac Enzymes: Recent Labs  Lab 08/04/18  1947 08/05/18 0223 08/05/18 0809  TROPONINI <0.03 <0.03 <0.03   BNP: Invalid input(s): POCBNP CBG: Recent Labs  Lab 08/04/18 2142 08/05/18 0814  GLUCAP 241* 281*   D-Dimer Recent Labs    08/04/18 1429  DDIMER <0.27   Hgb A1c No results for input(s): HGBA1C in the last 72 hours. Lipid Profile Recent Labs    08/05/18 0223  CHOL 190  HDL 33*  LDLCALC 119*  TRIG 188*  CHOLHDL 5.8   Thyroid function studies Recent Labs    08/05/18 0223  TSH 3.835   Anemia work up No results for input(s): VITAMINB12, FOLATE, FERRITIN, TIBC, IRON, RETICCTPCT in the last 72 hours. Urinalysis No results found for: COLORURINE, APPEARANCEUR, LABSPEC, PHURINE, GLUCOSEU, HGBUR, BILIRUBINUR, KETONESUR, PROTEINUR, UROBILINOGEN, NITRITE, LEUKOCYTESUR Sepsis Labs Invalid input(s): PROCALCITONIN,  WBC,  LACTICIDVEN Microbiology No results found for this or any previous visit (from the past 240 hour(s)).  EKG shows sinus rhythm with old septal infarct and normal axes and intervals.  Time coordinating discharge: 38 minutes  SIGNED:   Lahoma Crocker, MD  FACP [facility] 08/05/2018, 12:17 PM Pager   If 7PM-7AM, please contact night-coverage [email or web address] Password [email or web address]

## 2018-08-06 LAB — HEMOGLOBIN A1C
Hgb A1c MFr Bld: 8.8 % — ABNORMAL HIGH (ref 4.8–5.6)
Mean Plasma Glucose: 206 mg/dL

## 2018-08-06 LAB — HIV ANTIBODY (ROUTINE TESTING W REFLEX): HIV Screen 4th Generation wRfx: NONREACTIVE

## 2018-08-08 ENCOUNTER — Ambulatory Visit (INDEPENDENT_AMBULATORY_CARE_PROVIDER_SITE_OTHER): Payer: BLUE CROSS/BLUE SHIELD

## 2018-08-08 DIAGNOSIS — R079 Chest pain, unspecified: Secondary | ICD-10-CM

## 2018-08-08 LAB — EXERCISE TOLERANCE TEST
Estimated workload: 7.1 METS
Exercise duration (min): 6 min
Exercise duration (sec): 6 s
MPHR: 168 {beats}/min
Peak HR: 151 {beats}/min
Percent HR: 89 %
RPE: 17
Rest HR: 71 {beats}/min

## 2018-08-09 ENCOUNTER — Telehealth: Payer: Self-pay

## 2018-08-09 ENCOUNTER — Telehealth: Payer: Self-pay | Admitting: *Deleted

## 2018-08-09 ENCOUNTER — Other Ambulatory Visit: Payer: Self-pay

## 2018-08-09 ENCOUNTER — Emergency Department (HOSPITAL_COMMUNITY): Payer: BLUE CROSS/BLUE SHIELD

## 2018-08-09 ENCOUNTER — Emergency Department (HOSPITAL_COMMUNITY)
Admission: EM | Admit: 2018-08-09 | Discharge: 2018-08-09 | Disposition: A | Discharge status: Home / Self Care | Payer: BLUE CROSS/BLUE SHIELD | Attending: Emergency Medicine | Admitting: Emergency Medicine

## 2018-08-09 ENCOUNTER — Ambulatory Visit: Payer: BLUE CROSS/BLUE SHIELD | Admitting: Family Medicine

## 2018-08-09 ENCOUNTER — Encounter: Payer: Self-pay | Admitting: Family Medicine

## 2018-08-09 VITALS — BP 122/78 | HR 70 | Temp 98.1°F | Resp 16 | Ht 65.5 in | Wt 182.0 lb

## 2018-08-09 DIAGNOSIS — R51 Headache: Secondary | ICD-10-CM | POA: Insufficient documentation

## 2018-08-09 DIAGNOSIS — I10 Essential (primary) hypertension: Secondary | ICD-10-CM | POA: Diagnosis not present

## 2018-08-09 DIAGNOSIS — R200 Anesthesia of skin: Secondary | ICD-10-CM | POA: Diagnosis not present

## 2018-08-09 DIAGNOSIS — R519 Headache, unspecified: Secondary | ICD-10-CM

## 2018-08-09 DIAGNOSIS — Z7982 Long term (current) use of aspirin: Secondary | ICD-10-CM | POA: Insufficient documentation

## 2018-08-09 DIAGNOSIS — Z794 Long term (current) use of insulin: Secondary | ICD-10-CM | POA: Insufficient documentation

## 2018-08-09 DIAGNOSIS — F17210 Nicotine dependence, cigarettes, uncomplicated: Secondary | ICD-10-CM | POA: Diagnosis not present

## 2018-08-09 DIAGNOSIS — Z79899 Other long term (current) drug therapy: Secondary | ICD-10-CM | POA: Diagnosis not present

## 2018-08-09 DIAGNOSIS — E119 Type 2 diabetes mellitus without complications: Secondary | ICD-10-CM | POA: Diagnosis not present

## 2018-08-09 DIAGNOSIS — R4781 Slurred speech: Secondary | ICD-10-CM | POA: Diagnosis present

## 2018-08-09 LAB — ETHANOL: Alcohol, Ethyl (B): <10 mg/dL (ref <10)

## 2018-08-09 LAB — CBC
HCT: 40.9 % (ref 36.0–46.0)
Hemoglobin: 13.1 g/dL (ref 12.0–15.0)
MCH: 29.6 pg (ref 26.0–34.0)
MCHC: 32 g/dL (ref 30.0–36.0)
MCV: 92.5 fL (ref 80.0–100.0)
Platelets: 314 10*3/uL (ref 150–400)
RBC: 4.42 MIL/uL (ref 3.87–5.11)
RDW: 12.2 % (ref 11.5–15.5)
WBC: 12.2 10*3/uL — ABNORMAL HIGH (ref 4.0–10.5)
nRBC: 0 % (ref 0.0–0.2)

## 2018-08-09 LAB — COMPREHENSIVE METABOLIC PANEL
ALT: 24 U/L (ref 0–44)
AST: 18 U/L (ref 15–41)
Albumin: 3.7 g/dL (ref 3.5–5.0)
Alkaline Phosphatase: 54 U/L (ref 38–126)
Anion gap: 8 (ref 5–15)
BUN: 8 mg/dL (ref 6–20)
CO2: 26 mmol/L (ref 22–32)
Calcium: 10 mg/dL (ref 8.9–10.3)
Chloride: 108 mmol/L (ref 98–111)
Creatinine, Ser: 0.68 mg/dL (ref 0.44–1.00)
GFR calc Af Amer: >60 mL/min (ref >60)
GFR calc non Af Amer: >60 mL/min (ref >60)
Glucose, Bld: 97 mg/dL (ref 70–99)
Potassium: 3.9 mmol/L (ref 3.5–5.1)
Sodium: 142 mmol/L (ref 135–145)
Total Bilirubin: 0.4 mg/dL (ref 0.3–1.2)
Total Protein: 6.7 g/dL (ref 6.5–8.1)

## 2018-08-09 NOTE — Telephone Encounter (Signed)
-----   Message from Brittany M Strader, PA-C sent at 08/09/2018  7:10 AM EDT ----- Please let the patient know her treadmill stress test showed no significant EKG abnormalities and was a negative stress test. Please make sure she has follow-up scheduled with Dr. Nishan within the next 1-2 months (was evaluated by him during admission at [facility] - can follow-up with him in [place]). Thank you. 

## 2018-08-09 NOTE — Telephone Encounter (Signed)
-----   Message from Ellsworth Lennox, [place] sent at 08/09/2018  7:10 AM EDT ----- Please let the patient know her treadmill stress test showed no significant EKG abnormalities and was a negative stress test. Please make sure she has follow-up scheduled with Dr. Eden Emms within the next 1-2 months (was evaluated by him during admission at [facility] - can follow-up with him in [place]). Thank you.

## 2018-08-09 NOTE — Telephone Encounter (Signed)
Called patient with test results. No answer. Unable to leave msg to call back.  

## 2018-08-09 NOTE — Progress Notes (Signed)
Vitals:   08/09/18 0948  BP: 122/78  Pulse: 70  Resp: 16  Temp: 98.1 F (36.7 C)  SpO2: 98%     Pt with numbness to her face started 20 minutes ago, with left sided HA onset 30 min ago, sitting at home on the sofa,  Gradually worsening pain, described as sharp, stabbing,  Throbbing aching, gradually worsening  Hx of migraines, this does not feel like past migraines  Left whole side of face and head in ear, whole left head and left posterior neck  and neck pain Dizzy with movement or bending over and standing up  Husband states Friday and Saturday with difficulty walking and slurred speech.  Husband was concerned with slurred speech, had sometimes happened in the past the low sugar, but sugars were normal.  178 this morning  Has been taking insulin and BP meds She is out of simvastatin  Physical Exam  Constitutional: She is oriented to person, place, and time. Vital signs are normal. She appears well-developed.  HENT:  Head: Normocephalic and atraumatic.  Nose: Nose normal.  Eyes: Conjunctivae are normal. Right eye exhibits no discharge. Left eye exhibits no discharge.  Neck: Trachea normal and normal range of motion. Neck supple. No spinous process tenderness present. No neck rigidity. No tracheal deviation, no edema, no erythema and normal range of motion present.  Cardiovascular: Regular rhythm. Tachycardia present. Exam reveals no gallop and no friction rub.  No murmur heard. Pulses:      Radial pulses are 2+ on the right side, and 2+ on the left side.       Posterior tibial pulses are 2+ on the right side, and 2+ on the left side.  Pulmonary/Chest: Effort normal. No stridor. No respiratory distress.  Musculoskeletal: Normal range of motion.  Neurological: She is alert and oriented to person, place, and time. She exhibits normal muscle tone. Coordination normal.  MENTAL STATUS: AAOx3, memory intact, fund of knowledge appropriate  LANG/SPEECH: Naming and repetition  intact, fluent, follows 3-step commands  CRANIAL NERVES:   II: Pupils equal and reactive, no RAPD, no VF deficits   III, IV, VI: EOM intact, no gaze preference or deviation, no nystagmus.   V: normal sensation in V1, V2, and V3 segments bilaterally   VII: no asymmetry, no nasolabial fold flattening   VIII: normal hearing to speech   IX, X: normal palatal elevation, no uvular deviation   XI: 5/5 head turn and 5/5 shoulder shrug bilaterally   XII: midline tongue protrusion  MOTOR:  5/5 bilateral grip strength 5/5 strength dorsiflexion/plantarflexion b/l  SENSORY:  Normal to light touch  No pronator drift, difficulty with romberg   COORD: Normal finger to nose and heel to shin, no tremor, no dysmetria  STATION: normal stance, no truncal ataxia  GAIT: Normal; not able to do tiptoe or heel-toe walk  Speech slightly slurred   Skin: Skin is warm and dry. No rash noted.  Psychiatric: She has a normal mood and affect. Her behavior is normal.  Nursing note and vitals reviewed.     Pt presented to clinic for hospital f/up, admitted overnight 10//4/19-10/5/19 for CP with negative serial troponins.  I was pulled from another exam room for concerns of facial numbness, severe left sided head ache, slurred speech and weakness and change in gait.  Difficultly from family articulating last known normal time.  Pain and facial numbness onset 30 min ago - roughly 9:40 am  Speech changes noted by husband 4 days ago.  Heather Berry, PA-C 08/09/18 10:17 AM

## 2018-08-09 NOTE — ED Notes (Signed)
Patient transported to MRI 

## 2018-08-09 NOTE — ED Notes (Signed)
Pt back from CT

## 2018-08-09 NOTE — ED Provider Notes (Signed)
Patient received an handoff from Dr. Patria Mane at 4 PM with patient awaiting MRI of her brain to rule out any acute process including stroke.  Patient has had some nonspecific neurological symptoms over the last several days.  Patient has had intermittent chest pain as well.  Had a negative stress test yesterday.  No concern for cardiac process at this time.  CT scan showed nonspecific hypoattenuation.  We will follow-up MR of brain.  Patient is neurologically intact.  MRI shows no acute findings.  Recommend follow-up with primary care doctor.  Discharged in good condition.   Heather Norfolk, DO 08/09/18 1810

## 2018-08-09 NOTE — ED Provider Notes (Signed)
[facility] EMERGENCY DEPARTMENT Provider Note   CSN: [id number] Arrival date & time: 08/09/18  1049     History   Chief Complaint Chief Complaint  Patient presents with  . Aphasia  . Chest Pain  . Headache    HPI Renleigh Ouellet is a 52 y.o. female.  HPI Patient is a 52 year old female presents the emergency department with her husband regarding 3 days of slurred speech and husband reports some mild altered mental status.  She is continued having some intermittent chest pain.  She was seen and admitted to hospital for chest pain 5 days ago and was evaluated with a stress test which was negative for ischemia.  No history of stroke very patient does have a history of hypertension, hyperlipidemia, migraine headache.  She reports left-sided headache and left-sided facial numbness at this time.  She denies significant weakness of her arms or legs.  She states she feels off balance and her husband reports that has been assisting her with walking.    Past Medical History:  Diagnosis Date  . Arthritis    "qwhere" (08/04/2018)  . Depression    hx  . Fatty liver disease, nonalcoholic    [place] 2013 [place], Hepatitis Labs negative  . Frequent sinus infections   . GERD (gastroesophageal reflux disease)    EGD March 2018, neg H pylori  . Hyperlipidemia   . Hypertension   . Migraine    "3-4/year now" (08/04/2018)  . OSA on CPAP   . Pneumonia 2006   "right after I had pneumonia shot"  . PVC (premature ventricular contraction)    Holter Monitor  2015  . Type II diabetes mellitus [facility])     Patient Active Problem List   Diagnosis Date Noted  . Chest pain 08/04/2018  . Weight loss, unintentional 08/04/2018  . Onychomycosis 01/13/2018  . Type 2 diabetes mellitus without complications (HCC) 10/13/2017  . Hypertension 10/13/2017  . Hyperlipidemia associated with type 2 diabetes mellitus (HCC) 10/13/2017  . OSA (obstructive sleep apnea) 10/13/2017  . Chronic foot pain,  right 10/13/2017  . Chronic hip pain 10/13/2017  . Major depression 10/13/2017  . Tobacco use 10/13/2017  . Obesity (BMI 30-39.9) 10/13/2017  . Ventricular premature depolarization 12/04/2014  . Posterior tibial tendon dysfunction (PTTD) of right lower extremity 04/23/2013  . Hyperlipidemia 01/07/2011  . Gastroesophageal reflux disease 01/07/2011  . Cervical dysplasia 01/07/2011    Past Surgical History:  Procedure Laterality Date  . FOOT TENDON SURGERY Right 03/2017   "took tendon out; moved tendon behind that tendon to put in it's place; cut heel off, moved heel over, put 3 inch screw in to hold it in place"  . LAPAROSCOPIC CHOLECYSTECTOMY  1990s  . THROAT SURGERY  2017   "thought I had an abcess; wasn't that; had acute peri-arthritis"     OB History   None      Home Medications    Prior to Admission medications   Medication Sig Start Date End Date Taking? Authorizing Provider  aspirin 81 MG chewable tablet Chew 81 mg by mouth daily.    Yes [provider]  ciclopirox (PENLAC) 8 % solution Apply topically at bedtime. Apply over nail and skin daily over previous coat. After 7 days remove with alcohol and continue cycle. Patient taking differently: Apply 1 application topically See admin instructions. Apply over nail and skin daily over previous coat. After 7 days, remove with alcohol and continue cycle, as needed for intermittent fungal infections 02/07/18  Yes [place], Velna Hatchet, MD  gabapentin (NEURONTIN) 300 MG capsule Take 1 capsule (300 mg total) by mouth 3 (three) times daily. Patient taking differently: Take 900 mg by mouth at bedtime.  03/22/18  Yes [place], Velna Hatchet, MD  insulin aspart (NOVOLOG) 100 UNIT/ML injection Inject 5 Units into the skin 3 (three) times daily before meals. 02/07/18  Yes [place], Velna Hatchet, MD  insulin degludec (TRESIBA FLEXTOUCH) 100 UNIT/ML SOPN FlexTouch Pen Inject 0.25 mLs (25 Units total) into the skin daily at 10 pm. 02/07/18  Yes [place],  Velna Hatchet, MD  lisinopril (PRINIVIL,ZESTRIL) 20 MG tablet Take 1 tablet (20 mg total) by mouth daily. 02/10/18  Yes [place], Velna Hatchet, MD  metFORMIN (GLUCOPHAGE) 1000 MG tablet Take 1 tablet (1,000 mg total) by mouth 2 (two) times daily with a meal. 03/20/18  Yes [place], Velna Hatchet, MD  omeprazole (PRILOSEC) 40 MG capsule Take 1 capsule (40 mg total) by mouth daily. 02/07/18  Yes [place], Velna Hatchet, MD  simvastatin (ZOCOR) 40 MG tablet Take 1 tablet (40 mg total) by mouth daily at 6 PM. Patient not taking: Reported on 08/09/2018 11/17/17   Salley Scarlet, MD    Family History Family History  Problem Relation Age of Onset  . Diabetes Mother   . Diabetes Father   . Cancer Father        Abdominal stromal tumor   . Heart disease Father   . Heart disease Sister        HEART ATTACK  . Diabetes Maternal Grandmother   . Cancer Paternal Grandfather        Prostate/Colon Cancer    Social History Social History   Tobacco Use  . Smoking status: Current Every Day Smoker    Packs/day: 0.75    Years: 25.00    Pack years: 18.75    Types: Cigarettes    Start date: 10/13/1984  . Smokeless tobacco: Never Used  Substance Use Topics  . Alcohol use: Never    Frequency: Never  . Drug use: Never     Allergies   Codeine; Bactrim [sulfamethoxazole-trimethoprim]; and Wheat extract   Review of Systems Review of Systems  All other systems reviewed and are negative.    Physical Exam Updated Vital Signs BP 127/79   Pulse 72   Temp 98.7 F (37.1 C) (Oral)   Resp 17   Ht 5\' 5"  (1.651 m)   Wt 81.6 kg   LMP 09/02/2011 (Approximate)   SpO2 99%   BMI 29.95 kg/m   Physical Exam  Constitutional: She is oriented to person, place, and time. She appears well-developed and well-nourished. No distress.  HENT:  Head: Normocephalic and atraumatic.  Eyes: Pupils are equal, round, and reactive to light. EOM are normal.  Neck: Normal range of motion.  Cardiovascular: Normal rate, regular rhythm and  normal heart sounds.  Pulmonary/Chest: Effort normal and breath sounds normal.  Abdominal: Soft. She exhibits no distension. There is no tenderness.  Musculoskeletal: Normal range of motion.  Neurological: She is alert and oriented to person, place, and time.  5/5 strength in major muscle groups of  bilateral upper and lower extremities. Speech normal. No facial asymetry.   Skin: Skin is warm and dry.  Psychiatric: She has a normal mood and affect. Judgment normal.  Nursing note and vitals reviewed.    ED Treatments / Results  Labs (all labs ordered are listed, but only abnormal results are displayed) Labs Reviewed  CBC - Abnormal; Notable for the following  components:      Result Value   WBC 12.2 (*)    All other components within normal limits  COMPREHENSIVE METABOLIC PANEL  ETHANOL    EKG ECG interpretation   Date: 08/09/2018  Rate: 64  Rhythm: normal sinus rhythm  QRS Axis: normal  Intervals: normal  ST/T Wave abnormalities: normal  Conduction Disutrbances: none  Narrative Interpretation:   Old EKG Reviewed: No significant changes noted     Radiology Ct Head Wo Contrast  Result Date: 08/09/2018 CLINICAL DATA:  Pt was in her PCP office, and had sudden onset of LEFT face,ear,neck numbness, slurred speech. Pt.s husband states that pt actually began having slurred speech, unsteady gait on Saturday (4 days ago). Pt also c/o LEFT side h/a; Hx of HTN, is hypertensive today. EXAM: CT HEAD WITHOUT CONTRAST TECHNIQUE: Contiguous axial images were obtained from the base of the skull through the vertex without intravenous contrast. COMPARISON:  None. FINDINGS: Brain: There is a small focus of hypoattenuation in the subcortical white matter the posterior, inferior right frontal lobe. This could reflect a small area recent infarction but is more likely an area of chronic ischemic change. There is no other evidence of an infarct. There are no parenchymal masses or mass effect. The  ventricles normal in size and configuration. There are no extra-axial masses or abnormal fluid collections. There is no intracranial hemorrhage. Vascular: No hyperdense vessel or unexpected calcification. Skull: Normal. Negative for fracture or focal lesion. Sinuses/Orbits: Normal globes and orbits. Visualized sinuses and mastoid air cells are clear. Other: None. IMPRESSION: 1. Small area of hypoattenuation in the subcortical white matter of the right posterior inferior frontal lobe. This could reflect a recent infarct or an area chronic ischemic change. This could be further characterized with brain MRI. 2. No other abnormalities. Electronically Signed   By: Amie Portland M.D.   On: 08/09/2018 13:03    Procedures Procedures (including critical care time)  Medications Ordered in ED Medications - No data to display   Initial Impression / Assessment and Plan / ED Course  I have reviewed the triage vital signs and the nursing notes.  Pertinent labs & imaging results that were available during my care of the patient were reviewed by me and considered in my medical decision making (see chart for details).     Patient with concerns for possible stroke.  Nonspecific hypoattenuation found on head CT.  This will be further defined with MRI.  Patient has had ongoing intermittent chest pain.  Her troponin is negative.  Her EKG is without ischemic changes.  If her MRI shows a stroke she will be admitted to hospital.  If MRI is negative for stroke or bleed the patient is safe for discharge home with close primary care follow-up.  She has a reassuring stress test within the past several days.  Final Clinical Impressions(s) / ED Diagnoses   Final diagnoses:  None    ED Discharge Orders    None       Azalia Bilis, MD 08/09/18 1640

## 2018-08-09 NOTE — ED Triage Notes (Signed)
Patient seeing MD for follow-up chest pain admission on Friday and presents with headache, stated slurred speech and intermittent chest pain. Sent by family MD.

## 2018-08-09 NOTE — Telephone Encounter (Signed)
Called pt. No answer. Left message for pt to return call.  

## 2018-08-10 ENCOUNTER — Encounter: Payer: Self-pay | Admitting: Family Medicine

## 2018-08-10 ENCOUNTER — Telehealth: Payer: Self-pay | Admitting: *Deleted

## 2018-08-10 ENCOUNTER — Ambulatory Visit: Payer: BLUE CROSS/BLUE SHIELD | Admitting: Family Medicine

## 2018-08-10 VITALS — BP 128/82 | HR 61 | Temp 98.0°F | Resp 16 | Wt 181.4 lb

## 2018-08-10 DIAGNOSIS — Z09 Encounter for follow-up examination after completed treatment for conditions other than malignant neoplasm: Secondary | ICD-10-CM | POA: Diagnosis not present

## 2018-08-10 DIAGNOSIS — R9089 Other abnormal findings on diagnostic imaging of central nervous system: Secondary | ICD-10-CM | POA: Diagnosis not present

## 2018-08-10 DIAGNOSIS — G43009 Migraine without aura, not intractable, without status migrainosus: Secondary | ICD-10-CM

## 2018-08-10 DIAGNOSIS — R21 Rash and other nonspecific skin eruption: Secondary | ICD-10-CM | POA: Diagnosis not present

## 2018-08-10 MED ORDER — INSULIN PEN NEEDLE 32G X 4 MM MISC: 1 refills | Status: DC

## 2018-08-10 NOTE — Patient Instructions (Signed)
For rough patches on hand/knuckles try applying over the counter salicylic acid in concentrations between 17 and 50% are typically used for treatment.   Other wise moisturize often with vaseline  Follow up with neurology  Please return for other new complaints for a dedicated visit.

## 2018-08-10 NOTE — Progress Notes (Signed)
Patient in today for ER follow up for headache. States headache has resolved. Has been seeing chiropractor.

## 2018-08-10 NOTE — Telephone Encounter (Signed)
-----   Message from Lysbeth Galas sent at 08/10/2018  1:06 PM EDT ----- Needs needles called into [facility] at [employer].  Thank you

## 2018-08-10 NOTE — Progress Notes (Signed)
Patient ID: Heather Sloan, female    DOB: [birth year], 52 y.o.   MRN: [id number]  PCP: Heather Scarlet, MD  Chief Complaint  Patient presents with  . Headache    Subjective:    Heather Sloan is a 52 y.o. female, presents to clinic with CC of follow up from ER visit yesterday for severe left sided headache, numbness and tingling to face onset 20-30 min before appt, and slurred speech husband noticed 4 days before when she was discharged from the hospital for brief CP admission 08/04/18-08/05/18. Patient had a negative stress test 2 days ago as follow-up from her chest pain admission.  Yesterday in the ER CT and MRI of the brain were obtained to rule out any acute stroke.   Patient states that today she looked up a chiropractor and had some adjustments done and it completely relieved her neck pain and headache.  He also reports normal sensation in her face and normal speech today.   Risk factors of current smoker (18.75 pack-year hx), diabetes, hyperlipidemia and hypertension. Negative stress test 08/08/18 - to follow up with cardiology in 1-2 months.  Patient is concerned that her husband is extremely concerned with bumps on her knuckles that are very unsightly according to him.  Patient currently denies any chest pain, shortness of breath, headache, visual disturbances, numbness, focal weakness, ataxia or aphasia.  She states her sugars were normal this morning at home.    They did receive a phone call from cardiology stating her stress test was negative.  When I asked the patient if she would like to see neurology she said that she could see them in a couple weeks from now, and she her husband did not seem very concerned about the headache anymore, despite her being hysterical and sobbing yesterday in clinic prior to going to the ER.  Otherwise did try to address following up on her chest pain hospital admission and her ER visit to rule out stroke patient and her husband are more concerned  with rashes and bumps on her bottom and knuckles.  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 Chest pain 08/04/2018  . Weight loss, unintentional 08/04/2018  . Onychomycosis 01/13/2018  . Type 2 diabetes mellitus without complications (HCC) 10/13/2017  . Hypertension 10/13/2017  . Hyperlipidemia associated with type 2 diabetes mellitus (HCC) 10/13/2017  . OSA (obstructive sleep apnea) 10/13/2017  . Chronic foot pain, right 10/13/2017  . Chronic hip pain 10/13/2017  . Major depression 10/13/2017  . Tobacco use 10/13/2017  . Obesity (BMI 30-39.9) 10/13/2017  . Ventricular premature depolarization 12/04/2014  . Posterior tibial tendon dysfunction (PTTD) of right lower extremity 04/23/2013  . Hyperlipidemia 01/07/2011  . Gastroesophageal reflux disease 01/07/2011  . Cervical dysplasia 01/07/2011     Prior to Admission medications   Medication Sig Start Date End Date Taking? Authorizing Provider  aspirin 81 MG chewable tablet Chew 81 mg by mouth daily.     [provider]  ciclopirox (PENLAC) 8 % solution Apply topically at bedtime. Apply over nail and skin daily over previous coat. After 7 days remove with alcohol and continue cycle. Patient taking differently: Apply 1 application topically See admin instructions.  Apply over nail and skin daily over previous coat. After 7 days, remove with alcohol and continue cycle, as needed for intermittent fungal infections 02/07/18   Heather Scarlet, MD  gabapentin (NEURONTIN) 300 MG capsule Take 1 capsule (300 mg total) by mouth 3 (three) times daily. Patient taking differently: Take 900 mg by mouth at bedtime.  03/22/18   [place], Velna Hatchet, MD  insulin aspart  (NOVOLOG) 100 UNIT/ML injection Inject 5 Units into the skin 3 (three) times daily before meals. 02/07/18   [place], Velna Hatchet, MD  insulin degludec (TRESIBA FLEXTOUCH) 100 UNIT/ML SOPN FlexTouch Pen Inject 0.25 mLs (25 Units total) into the skin daily at 10 pm. 02/07/18   Heather Scarlet, MD  lisinopril (PRINIVIL,ZESTRIL) 20 MG tablet Take 1 tablet (20 mg total) by mouth daily. 02/10/18   Heather Scarlet, MD  metFORMIN (GLUCOPHAGE) 1000 MG tablet Take 1 tablet (1,000 mg total) by mouth 2 (two) times daily with a meal. 03/20/18   [place], Velna Hatchet, MD  omeprazole (PRILOSEC) 40 MG capsule Take 1 capsule (40 mg total) by mouth daily. 02/07/18   Heather Scarlet, MD  simvastatin (ZOCOR) 40 MG tablet Take 1 tablet (40 mg total) by mouth daily at 6 PM. Patient not taking: Reported on 08/09/2018 11/17/17   Heather Scarlet, MD     Allergies  Allergen Reactions  . Codeine Other (See Comments)    Passed out  . Bactrim [Sulfamethoxazole-Trimethoprim] Hives  . Wheat Extract Diarrhea and Other (See Comments)    Irritable bowel, bloating     Family History  Problem Relation Age of Onset  . Diabetes Mother   . Diabetes Father   . Cancer Father        Abdominal stromal tumor   . Heart disease Father   . Heart disease Sister        HEART ATTACK  . Diabetes Maternal Grandmother   . Cancer Paternal Grandfather        Prostate/Colon Cancer     Social History   Socioeconomic History  . Marital status: Married    Spouse name: Not on file  . Number of children: Not on file  . Years of education: Not on file  . Highest education level: Not on file  Occupational History  . Not on file  Social Needs  . Financial resource strain: Not on file  . Food insecurity:    Worry: Not on file    Inability: Not on file  . Transportation needs:    Medical: Not on file    Non-medical: Not on file  Tobacco Use  . Smoking status: Current Every Day Smoker    Packs/day: 0.75    Years: 25.00    Pack years:  18.75    Types: Cigarettes    Start date: 10/13/1984  . Smokeless tobacco: Never Used  Substance and Sexual Activity  . Alcohol use: Never    Frequency: Never  . Drug use: Never  . Sexual activity: Not Currently  Lifestyle  . Physical activity:    Days per week: Not on file    Minutes per session: Not on file  . Stress: Not on file  Relationships  . Social connections:    Talks on phone: Not on file    Gets together: Not on file    Attends religious service: Not on file    Active member of club or organization: Not on file    Attends meetings of clubs or organizations: Not  on file    Relationship status: Not on file  . Intimate partner violence:    Fear of current or ex partner: Not on file    Emotionally abused: Not on file    Physically abused: Not on file    Forced sexual activity: Not on file  Other Topics Concern  . Not on file  Social History Narrative  . Not on file     Review of Systems  Constitutional: Negative.   HENT: Negative.   Eyes: Negative.   Respiratory: Negative.   Cardiovascular: Negative.   Gastrointestinal: Negative.   Endocrine: Negative.   Genitourinary: Negative.   Musculoskeletal: Negative.   Skin: Positive for rash. Negative for color change, pallor and wound.  Allergic/Immunologic: Negative.   Neurological: Negative.   Hematological: Negative.   Psychiatric/Behavioral: Negative.   All other systems reviewed and are negative.      Objective:    Vitals:   08/10/18 1231  BP: 128/82  Pulse: 61  Resp: 16  Temp: 98 F (36.7 C)  TempSrc: Oral  SpO2: 98%  Weight: 181 lb 6 oz (82.3 kg)      Physical Exam  Constitutional: She is oriented to person, place, and time. She appears well-developed and well-nourished. No distress.  HENT:  Head: Normocephalic and atraumatic.  Nose: Nose normal.  Mouth/Throat: Oropharynx is clear and moist.  Eyes: Pupils are equal, round, and reactive to light. Conjunctivae and EOM are normal. Right  eye exhibits no discharge. Left eye exhibits no discharge.  Neck: Trachea normal, normal range of motion, full passive range of motion without pain and phonation normal. Neck supple. No tracheal tenderness, no spinous process tenderness and no muscular tenderness present. No neck rigidity. No edema, no erythema and normal range of motion present. No thyromegaly present.  Cardiovascular: Normal rate, regular rhythm, normal heart sounds and intact distal pulses.  Pulmonary/Chest: Effort normal and breath sounds normal. No stridor. No respiratory distress. She has no wheezes. She has no rales.  Abdominal: Soft. Bowel sounds are normal.  Lymphadenopathy:    She has no cervical adenopathy.  Neurological: She is alert and oriented to person, place, and time. She has normal strength. She displays no tremor. No cranial nerve deficit or sensory deficit. She exhibits normal muscle tone. She displays no seizure activity. Coordination and gait normal.  Speech clear  Skin: Skin is warm and intact. Capillary refill takes less than 2 seconds. Rash noted. Rash is papular and nodular. She is not diaphoretic. No cyanosis. No pallor. Nails show no clubbing.  Psychiatric: She has a normal mood and affect. Her speech is normal and behavior is normal.  Nursing note and vitals reviewed.         Assessment & Plan:      ICD-10-CM   1. 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 Abnormal finding on MRI of brain R90.89 Ambulatory referral to Neurology  3. Rash R21 Ambulatory referral to Dermatology  4. Encounter for examination following treatment at hospital Z09     Patient was sent to the ER yesterday for reports of severe left-sided headache and facial pain with associated numbness and tingling and several days of slurred speech.  She had CT scan and MRI which were unremarkable/active for stroke.  She states that her headache resolved this morning when she went to a chiropractor.  She is  well-appearing without any slurred speech or neuro deficit.  Patient today seems very unconcerned with a headache or recent symptoms and  agrees to go to neurology for further evaluation but states she could go in 2 weeks after discussing findings on MRI and discussing options of further work-up for her symptoms.  I did discuss with her that they could be TIA and she is moderate risk for stroke due to presentation, history of current smoker, hyperlipidemia, diabetes and hypertension.  Patient today seems very unconcerned with following up on headache numbness and aphasia and recent chest pain admission and did want to discuss a rash that has been ongoing on her hands and knuckles for some time.  The areas appear to be like warts advised her to put salicylic acid on it and moisturize and told her she could follow-up with dermatology.    Did discuss with them the only other thing that you find that would be important to evaluate her further although there is no evidence of stroke currently, that included echocardiogram and carotid artery ultrasound.  Patient declined this and would like to does follow with neurology in several weeks from now.  She believes that the headache and all of her other symptoms were from her neck pain and from not going to a chiropractor since moving here 1 year ago.   Heather Berry, PA-C 08/10/18 12:52 PM

## 2018-08-14 ENCOUNTER — Encounter: Payer: Self-pay | Admitting: *Deleted

## 2018-10-17 ENCOUNTER — Encounter: Payer: Self-pay | Admitting: Diagnostic Neuroimaging

## 2018-10-17 ENCOUNTER — Ambulatory Visit: Payer: BLUE CROSS/BLUE SHIELD | Admitting: Diagnostic Neuroimaging

## 2018-10-17 VITALS — BP 132/88 | HR 76 | Ht 65.5 in | Wt 192.8 lb

## 2018-10-17 DIAGNOSIS — G459 Transient cerebral ischemic attack, unspecified: Secondary | ICD-10-CM | POA: Diagnosis not present

## 2018-10-17 DIAGNOSIS — G43109 Migraine with aura, not intractable, without status migrainosus: Secondary | ICD-10-CM | POA: Diagnosis not present

## 2018-10-17 NOTE — Progress Notes (Signed)
[facility]  PATIENT: Heather PouchVicki Sloan DOB: [birth year]  REFERRING CLINICIAN: Danelle Berryapia, Leisa HISTORY FROM: patient and husband  REASON FOR VISIT: new consult    HISTORICAL  CHIEF COMPLAINT:  Chief Complaint  Patient presents with  . Migraine    rm 6, New Pt, husband- Rosanne AshingJim, "to have the spot in my brain checked and for my migraines"    HISTORY OF PRESENT ILLNESS:   52 year old female here for evaluation of headaches and abnormal MRI.  In her 30s patient had onset of headaches with pressure sensation, photophobia, phonophobia, throbbing sensation, nausea.  These would happen 1-2 times per month.  08/09/2018 patient went to the emergency room for severe left-sided headache, numbness, tingling, slurred speech.  She was evaluated with CT and MRI of the brain.  She has history of snoring, tobacco abuse, diabetes, hyperlipidemia, hypertension.  Since that time symptoms have resolved.    REVIEW OF SYSTEMS: Full 14 system review of systems performed and negative with exception of: Sleepiness confusion headache numbness weakness snoring restless legs obstructive sleep apnea on CPAP anxiety not asleep racing thoughts joint pain joint swelling cramps aching muscles allergies runny nose blurred vision fatigue rash itching.  ALLERGIES: Allergies  Allergen Reactions  . Codeine Other (See Comments)    Passed out  . Bactrim [Sulfamethoxazole-Trimethoprim] Hives  . Wheat Extract Diarrhea and Other (See Comments)    Irritable bowel, bloating    HOME MEDICATIONS: Outpatient Medications Prior to Visit  Medication Sig Dispense Refill  . aspirin 81 MG chewable tablet Chew 81 mg by mouth daily.     . ciclopirox (PENLAC) 8 % solution Apply topically at bedtime. Apply over nail and skin daily over previous coat. After 7 days remove with alcohol and continue cycle. (Patient taking differently: Apply 1 application topically See admin instructions. Apply over nail and skin daily over  previous coat. After 7 days, remove with alcohol and continue cycle, as needed for intermittent fungal infections) 6.6 mL 3  . gabapentin (NEURONTIN) 300 MG capsule Take 1 capsule (300 mg total) by mouth 3 (three) times daily. (Patient taking differently: Take 900 mg by mouth at bedtime. ) 270 capsule 3  . insulin aspart (NOVOLOG) 100 UNIT/ML injection Inject 5 Units into the skin 3 (three) times daily before meals. 15 mL 3  . insulin degludec (TRESIBA FLEXTOUCH) 100 UNIT/ML SOPN FlexTouch Pen Inject 0.25 mLs (25 Units total) into the skin daily at 10 pm. 15 mL 3  . Insulin Pen Needle (BD PEN NEEDLE NANO U/F) 32G X 4 MM MISC Use as directed to inject insulin QD. 100 each 1  . lisinopril (PRINIVIL,ZESTRIL) 20 MG tablet Take 1 tablet (20 mg total) by mouth daily. 90 tablet 3  . metFORMIN (GLUCOPHAGE) 1000 MG tablet Take 1 tablet (1,000 mg total) by mouth 2 (two) times daily with a meal. 180 tablet 3  . omeprazole (PRILOSEC) 40 MG capsule Take 1 capsule (40 mg total) by mouth daily. 90 capsule 3  . simvastatin (ZOCOR) 40 MG tablet Take 1 tablet (40 mg total) by mouth daily at 6 PM. 90 tablet 3   No facility-administered medications prior to visit.     PAST MEDICAL HISTORY: Past Medical History:  Diagnosis Date  . Arthritis    "qwhere" (08/04/2018)  . Depression    hx  . Fatty liver disease, nonalcoholic    US 2013 [place], Hepatitis Labs negative  . Frequent sinus infections   . GERD (gastroesophageal reflux disease)    EGD March  2018, neg H pylori  . Hyperlipidemia   . Hypertension   . Migraine    "3-4/year now" (08/04/2018)  . OSA on CPAP   . Pneumonia 2006   "right after I had pneumonia shot"  . PVC (premature ventricular contraction)    Holter Monitor  2015  . Type II diabetes mellitus (HCC)     PAST SURGICAL HISTORY: Past Surgical History:  Procedure Laterality Date  . FOOT TENDON SURGERY Right 03/2017   "took tendon out; moved tendon behind that tendon to put in it's place;  cut heel off, moved heel over, put 3 inch screw in to hold it in place"  . LAPAROSCOPIC CHOLECYSTECTOMY  1990s  . THROAT SURGERY  2017   "thought I had an abcess; wasn't that; had acute peri-arthritis"    FAMILY HISTORY: Family History  Problem Relation Age of Onset  . Diabetes Mother   . Diabetes Father   . Cancer Father        Abdominal stromal tumor   . Heart disease Father   . Heart disease Sister        HEART ATTACK  . Diabetes Maternal Grandmother   . Cancer Paternal Grandfather        Prostate/Colon Cancer    SOCIAL HISTORY: Social History   Socioeconomic History  . Marital status: Married    Spouse name: Rosanne Ashing  . Number of children: 1  . Years of education: 76  . Highest education level: Not on file  Occupational History    Comment: Location manager  Social Needs  . Financial resource strain: Not on file  . Food insecurity:    Worry: Not on file    Inability: Not on file  . Transportation needs:    Medical: Not on file    Non-medical: Not on file  Tobacco Use  . Smoking status: Current Every Day Smoker    Packs/day: 0.75    Years: 25.00    Pack years: 18.75    Types: Cigarettes    Start date: 10/13/1984  . Smokeless tobacco: Never Used  Substance and Sexual Activity  . Alcohol use: Never    Frequency: Never  . Drug use: Never  . Sexual activity: Not Currently  Lifestyle  . Physical activity:    Days per week: Not on file    Minutes per session: Not on file  . Stress: Not on file  Relationships  . Social connections:    Talks on phone: Not on file    Gets together: Not on file    Attends religious service: Not on file    Active member of club or organization: Not on file    Attends meetings of clubs or organizations: Not on file    Relationship status: Not on file  . Intimate partner violence:    Fear of current or ex partner: Not on file    Emotionally abused: Not on file    Physically abused: Not on file    Forced sexual activity: Not on  file  Other Topics Concern  . Not on file  Social History Narrative   Lives with husband   Caffeine- coffee, 1 cup daily, soda 3 cans daily     PHYSICAL EXAM  GENERAL EXAM/CONSTITUTIONAL: Vitals:  Vitals:   10/17/18 1554  BP: 132/88  Pulse: 76  Weight: 192 lb 12.8 oz (87.5 kg)  Height: 5' 5.5" (1.664 m)     Body mass index is 31.6 kg/m. Wt Readings from Last 3 Encounters:  10/17/18 192 lb 12.8 oz (87.5 kg)  08/10/18 181 lb 6 oz (82.3 kg)  08/09/18 180 lb (81.6 kg)     Patient is in no distress; well developed, nourished and groomed; neck is supple  CARDIOVASCULAR:  Examination of carotid arteries is normal; no carotid bruits  Regular rate and rhythm, no murmurs  Examination of peripheral vascular system by observation and palpation is normal  EYES:  Ophthalmoscopic exam of optic discs and posterior segments is normal; no papilledema or hemorrhages  Visual Acuity Screening   Right eye Left eye Both eyes  Without correction:     With correction: 20/30 20/30   Comments: bifocals    MUSCULOSKELETAL:  Gait, strength, tone, movements noted in Neurologic exam below  NEUROLOGIC: MENTAL STATUS:  No flowsheet data found.  awake, alert, oriented to person, place and time  recent and remote memory intact  normal attention and concentration  language fluent, comprehension intact, naming intact  fund of knowledge appropriate  CRANIAL NERVE:   2nd - no papilledema on fundoscopic exam  2nd, 3rd, 4th, 6th - pupils equal and reactive to light, visual fields full to confrontation, extraocular muscles intact, no nystagmus  5th - facial sensation symmetric  7th - facial strength symmetric  8th - hearing intact  9th - palate elevates symmetrically, uvula midline  11th - shoulder shrug symmetric  12th - tongue protrusion midline  MOTOR:   normal bulk and tone, full strength in the BUE, BLE  SENSORY:   normal and symmetric to light touch,  temperature, vibration  COORDINATION:   finger-nose-finger, fine finger movements normal  REFLEXES:   deep tendon reflexes present and symmetric  GAIT/STATION:   narrow based gait     DIAGNOSTIC DATA (LABS, IMAGING, TESTING) - I reviewed patient records, labs, notes, testing and imaging myself where available.  Lab Results  Component Value Date   WBC 12.2 (H) 08/09/2018   HGB 13.1 08/09/2018   HCT 40.9 08/09/2018   MCV 92.5 08/09/2018   PLT 314 08/09/2018      Component Value Date/Time   NA 142 08/09/2018 1107   K 3.9 08/09/2018 1107   CL 108 08/09/2018 1107   CO2 26 08/09/2018 1107   GLUCOSE 97 08/09/2018 1107   BUN 8 08/09/2018 1107   CREATININE 0.68 08/09/2018 1107   CREATININE 0.89 02/07/2018 0816   CALCIUM 10.0 08/09/2018 1107   PROT 6.7 08/09/2018 1107   ALBUMIN 3.7 08/09/2018 1107   AST 18 08/09/2018 1107   ALT 24 08/09/2018 1107   ALKPHOS 54 08/09/2018 1107   BILITOT 0.4 08/09/2018 1107   GFRNONAA >60 08/09/2018 1107   GFRNONAA 75 02/07/2018 0816   GFRAA >60 08/09/2018 1107   GFRAA 87 02/07/2018 0816   Lab Results  Component Value Date   CHOL 190 08/05/2018   HDL 33 (L) 08/05/2018   LDLCALC 119 (H) 08/05/2018   TRIG 188 (H) 08/05/2018   CHOLHDL 5.8 08/05/2018   Lab Results  Component Value Date   HGBA1C 8.8 (H) 08/05/2018   No results found for: VITAMINB12 Lab Results  Component Value Date   TSH 3.835 08/05/2018    08/09/18 MRI brain [I reviewed images myself and agree with interpretation. -[facility]P]  1.  No acute intracranial abnormality. 2. Solitary small area of nonspecific signal changes in the right frontal operculum white matter corresponding to the CT finding today. 3. 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   ASSESSMENT AND PLAN  52 y.o.  year old female here with history of migraine headaches since age 28 years old, now with new onset of transient slurred speech, numbness, headache.   Could represent TIA versus complicated migraine.  Will complete TIA work-up and review vascular risk factors.  Dx:  1. TIA (transient ischemic attack)   2. Migraine with aura and without status migrainosus, not intractable      PLAN:  TRANSIENT SLURRED SPEECH / NUMBNESS (left face) - complete TIA workup (TTE, carotid u/s) - continue aspirin, statin, DM control - smoking cessation reviewed - continue CPAP - optimize nutrition and exercise  MIGRAINE WITH AURA (1-2 per month) - ibuprofen / tylenol as needed  Orders Placed This Encounter  Procedures  . ECHOCARDIOGRAM COMPLETE   Return pending test results.  I reviewed images, labs, notes, records myself. I summarized findings and reviewed with patient, for this high risk condition (TIA) requiring high complexity decision making.     Suanne Marker, MD 10/17/2018, 4:12 PM Certified in Neurology, Neurophysiology and Neuroimaging  [facility] [address] [place], [place] [address] [phone]

## 2018-10-17 NOTE — Patient Instructions (Signed)
TRANSIENT SLURRED SPEECH / NUMBNESS (left face) - complete TIA workup (ultrasoudn heart and neck) - continue aspirin, statin, DM control - try to stop smoking - continue CPAP - improve nutrition and exercise  MIGRAINE WITH AURA (1-2 per month) - ibuprofen / tylenol as needed

## 2018-10-23 ENCOUNTER — Ambulatory Visit (HOSPITAL_COMMUNITY)
Admission: RE | Admit: 2018-10-23 | Discharge: 2018-10-23 | Disposition: A | Discharge status: Home / Self Care | Payer: BLUE CROSS/BLUE SHIELD | Source: Ambulatory Visit | Attending: Internal Medicine | Admitting: Internal Medicine

## 2018-10-23 ENCOUNTER — Other Ambulatory Visit: Payer: Self-pay | Admitting: Family Medicine

## 2018-10-23 DIAGNOSIS — G459 Transient cerebral ischemic attack, unspecified: Secondary | ICD-10-CM

## 2018-10-26 ENCOUNTER — Other Ambulatory Visit: Payer: Self-pay

## 2018-10-26 ENCOUNTER — Other Ambulatory Visit: Payer: Self-pay | Admitting: *Deleted

## 2018-10-26 ENCOUNTER — Ambulatory Visit (HOSPITAL_COMMUNITY): Payer: BLUE CROSS/BLUE SHIELD | Attending: Cardiovascular Disease

## 2018-10-26 ENCOUNTER — Telehealth: Payer: Self-pay | Admitting: Neurology

## 2018-10-26 DIAGNOSIS — G459 Transient cerebral ischemic attack, unspecified: Secondary | ICD-10-CM | POA: Diagnosis not present

## 2018-10-26 MED ORDER — SIMVASTATIN 40 MG PO TABS: 40.0000 mg | ORAL_TABLET | Freq: Every day | ORAL | 3 refills | Status: DC

## 2018-10-26 MED ORDER — LISINOPRIL 20 MG PO TABS: 20.0000 mg | ORAL_TABLET | Freq: Every day | ORAL | 0 refills | Status: DC

## 2018-10-26 NOTE — Telephone Encounter (Signed)
Left patient a detailed message, with results, on voicemail (ok per DPR).  Provided our number to call back with any questions.  

## 2018-10-26 NOTE — Telephone Encounter (Signed)
US showed no significant stenosis at right IC, left IC showed less than 39% stenosis. Bilateral vertebral arteries are anterograde flow.    Summary: Right Carotid: The extracranial vessels were near-normal with only minimal wall                thickening or plaque.  Left Carotid: Velocities in the left ICA are consistent with a 1-39% stenosis.  Vertebrals:  Bilateral vertebral arteries demonstrate antegrade flow. Subclavians: Normal flow hemodynamics were seen in bilateral subclavian              arteries.

## 2018-10-27 ENCOUNTER — Telehealth: Payer: Self-pay | Admitting: *Deleted

## 2018-10-27 NOTE — Telephone Encounter (Signed)
Relayed via VM home (ok per DPR) that  Her echocardiogram results were normal.  She is to call back if questions.

## 2018-10-27 NOTE — Telephone Encounter (Signed)
-----   Message from Richard A Sater, MD sent at 10/27/2018 12:00 PM EST ----- Please let the patient know that the echocardiogram was normal. 

## 2018-10-27 NOTE — Telephone Encounter (Signed)
-----   Message from Asa Lenteichard A Sater, MD sent at 10/27/2018 12:00 PM EST ----- Please let the patient know that the echocardiogram was normal.

## 2018-12-25 ENCOUNTER — Other Ambulatory Visit: Payer: Self-pay

## 2018-12-25 ENCOUNTER — Other Ambulatory Visit: Payer: BLUE CROSS/BLUE SHIELD

## 2018-12-25 DIAGNOSIS — Z794 Long term (current) use of insulin: Principal | ICD-10-CM

## 2018-12-25 DIAGNOSIS — E119 Type 2 diabetes mellitus without complications: Secondary | ICD-10-CM

## 2018-12-25 NOTE — Progress Notes (Signed)
FYI patient came in and stated that  her specialist changed her  Gabapentin to 300 mg three times a day. Patient stated that new dose of gabapentin is not touching her pain.

## 2018-12-25 NOTE — Progress Notes (Signed)
She would need to discuss with specialist that changed her meds Otherwise needs OV to discuss diabetes, that she came and had labs for drawn and we can discuss in further detail I dont see appt scheduled with me

## 2018-12-25 NOTE — Progress Notes (Signed)
Tried to call no numbers in chart work for patient.

## 2018-12-26 LAB — HEMOGLOBIN A1C
Hgb A1c MFr Bld: 9.1 % of total Hgb — ABNORMAL HIGH (ref <5.7)
Mean Plasma Glucose: 214 (calc)
eAG (mmol/L): 11.9 (calc)

## 2018-12-27 ENCOUNTER — Other Ambulatory Visit: Payer: Self-pay | Admitting: *Deleted

## 2018-12-27 MED ORDER — DICLOFENAC SODIUM 75 MG PO TBEC: 75.0000 mg | DELAYED_RELEASE_TABLET | Freq: Two times a day (BID) | ORAL | 0 refills | Status: DC

## 2018-12-27 NOTE — Progress Notes (Signed)
Patient has appointment scheduled for 01/02/2019

## 2019-01-02 ENCOUNTER — Ambulatory Visit: Payer: Self-pay | Admitting: Family Medicine

## 2019-02-19 ENCOUNTER — Other Ambulatory Visit: Payer: Self-pay | Admitting: *Deleted

## 2019-02-19 MED ORDER — LISINOPRIL 20 MG PO TABS: 20.0000 mg | ORAL_TABLET | Freq: Every day | ORAL | 0 refills | Status: DC

## 2019-02-20 ENCOUNTER — Other Ambulatory Visit: Payer: Self-pay | Admitting: *Deleted

## 2019-02-20 MED ORDER — METFORMIN HCL 1000 MG PO TABS: 1000.0000 mg | ORAL_TABLET | Freq: Two times a day (BID) | ORAL | 3 refills | Status: DC

## 2019-03-01 ENCOUNTER — Other Ambulatory Visit: Payer: Self-pay | Admitting: *Deleted

## 2019-03-01 MED ORDER — OMEPRAZOLE 40 MG PO CPDR: 40.0000 mg | DELAYED_RELEASE_CAPSULE | Freq: Every day | ORAL | 3 refills | Status: DC

## 2019-03-29 ENCOUNTER — Other Ambulatory Visit: Payer: Self-pay | Admitting: Family Medicine

## 2019-03-29 NOTE — Telephone Encounter (Signed)
Pt needs refill on gabapentin to [facility] [place]

## 2019-03-30 ENCOUNTER — Telehealth: Payer: Self-pay | Admitting: Family Medicine

## 2019-03-30 NOTE — Telephone Encounter (Signed)
Patient is calling to say she would like her prescriptions to be sent to [facility] from now on not on [address] [phone] if any questions

## 2019-04-04 MED ORDER — GABAPENTIN 300 MG PO CAPS: 300.0000 mg | ORAL_CAPSULE | Freq: Three times a day (TID) | ORAL | 3 refills | Status: DC

## 2019-04-04 NOTE — Telephone Encounter (Signed)
Patient's preferred pharmacy updated to [facility]

## 2019-04-25 ENCOUNTER — Other Ambulatory Visit: Payer: BC Managed Care – PPO

## 2019-04-25 ENCOUNTER — Other Ambulatory Visit: Payer: Self-pay

## 2019-04-25 DIAGNOSIS — Z794 Long term (current) use of insulin: Secondary | ICD-10-CM

## 2019-04-25 DIAGNOSIS — E119 Type 2 diabetes mellitus without complications: Secondary | ICD-10-CM

## 2019-04-25 DIAGNOSIS — I10 Essential (primary) hypertension: Secondary | ICD-10-CM

## 2019-04-25 DIAGNOSIS — E1169 Type 2 diabetes mellitus with other specified complication: Secondary | ICD-10-CM

## 2019-04-25 DIAGNOSIS — E785 Hyperlipidemia, unspecified: Secondary | ICD-10-CM

## 2019-04-26 LAB — CBC WITH DIFFERENTIAL/PLATELET
Absolute Monocytes: 917 cells/uL (ref 200–950)
Basophils Absolute: 103 cells/uL (ref 0–200)
Basophils Relative: 1 %
Eosinophils Absolute: 391 cells/uL (ref 15–500)
Eosinophils Relative: 3.8 %
HCT: 38.5 % (ref 35.0–45.0)
Hemoglobin: 12.8 g/dL (ref 11.7–15.5)
Lymphs Abs: 3080 cells/uL (ref 850–3900)
MCH: 30.8 pg (ref 27.0–33.0)
MCHC: 33.2 g/dL (ref 32.0–36.0)
MCV: 92.5 fL (ref 80.0–100.0)
MPV: 9.3 fL (ref 7.5–12.5)
Monocytes Relative: 8.9 %
Neutro Abs: 5809 cells/uL (ref 1500–7800)
Neutrophils Relative %: 56.4 %
Platelets: 286 10*3/uL (ref 140–400)
RBC: 4.16 10*6/uL (ref 3.80–5.10)
RDW: 11.8 % (ref 11.0–15.0)
Total Lymphocyte: 29.9 %
WBC: 10.3 10*3/uL (ref 3.8–10.8)

## 2019-04-26 LAB — COMPLETE METABOLIC PANEL WITH GFR
AG Ratio: 1.9 (calc) (ref 1.0–2.5)
ALT: 14 U/L (ref 6–29)
AST: 11 U/L (ref 10–35)
Albumin: 4.2 g/dL (ref 3.6–5.1)
Alkaline phosphatase (APISO): 45 U/L (ref 37–153)
BUN: 16 mg/dL (ref 7–25)
CO2: 25 mmol/L (ref 20–32)
Calcium: 9.4 mg/dL (ref 8.6–10.4)
Chloride: 106 mmol/L (ref 98–110)
Creat: 0.68 mg/dL (ref 0.50–1.05)
GFR, Est African American: 117 mL/min/{1.73_m2} (ref >=60)
GFR, Est Non African American: 101 mL/min/{1.73_m2} (ref >=60)
Globulin: 2.2 g/dL (calc) (ref 1.9–3.7)
Glucose, Bld: 190 mg/dL — ABNORMAL HIGH (ref 65–99)
Potassium: 4.8 mmol/L (ref 3.5–5.3)
Sodium: 138 mmol/L (ref 135–146)
Total Bilirubin: 0.4 mg/dL (ref 0.2–1.2)
Total Protein: 6.4 g/dL (ref 6.1–8.1)

## 2019-04-26 LAB — HEMOGLOBIN A1C
Hgb A1c MFr Bld: 8.4 % of total Hgb — ABNORMAL HIGH (ref <5.7)
Mean Plasma Glucose: 194 (calc)
eAG (mmol/L): 10.8 (calc)

## 2019-04-26 LAB — LIPID PANEL
Cholesterol: 126 mg/dL (ref <200)
HDL: 41 mg/dL — ABNORMAL LOW (ref >=50)
LDL Cholesterol (Calc): 71 mg/dL (calc)
Non-HDL Cholesterol (Calc): 85 mg/dL (calc) (ref <130)
Total CHOL/HDL Ratio: 3.1 (calc) (ref <5.0)
Triglycerides: 65 mg/dL (ref <150)

## 2019-04-30 ENCOUNTER — Other Ambulatory Visit: Payer: Self-pay | Admitting: Family Medicine

## 2019-05-02 ENCOUNTER — Other Ambulatory Visit: Payer: Self-pay

## 2019-05-02 ENCOUNTER — Other Ambulatory Visit: Payer: BLUE CROSS/BLUE SHIELD

## 2019-05-02 DIAGNOSIS — Z20822 Contact with and (suspected) exposure to covid-19: Secondary | ICD-10-CM

## 2019-05-03 ENCOUNTER — Ambulatory Visit: Payer: BC Managed Care – PPO | Admitting: Family Medicine

## 2019-05-09 LAB — NOVEL CORONAVIRUS, NAA: SARS-CoV-2, NAA: NOT DETECTED

## 2019-05-14 ENCOUNTER — Encounter: Payer: Self-pay | Admitting: Family Medicine

## 2019-05-14 ENCOUNTER — Other Ambulatory Visit: Payer: Self-pay

## 2019-05-14 ENCOUNTER — Ambulatory Visit: Payer: BC Managed Care – PPO | Admitting: Family Medicine

## 2019-05-14 VITALS — BP 138/78 | HR 68 | Temp 98.5°F | Resp 14 | Ht 65.5 in | Wt 185.0 lb

## 2019-05-14 DIAGNOSIS — M79671 Pain in right foot: Secondary | ICD-10-CM

## 2019-05-14 DIAGNOSIS — E785 Hyperlipidemia, unspecified: Secondary | ICD-10-CM

## 2019-05-14 DIAGNOSIS — E7800 Pure hypercholesterolemia, unspecified: Secondary | ICD-10-CM

## 2019-05-14 DIAGNOSIS — I779 Disorder of arteries and arterioles, unspecified: Secondary | ICD-10-CM | POA: Insufficient documentation

## 2019-05-14 DIAGNOSIS — E1169 Type 2 diabetes mellitus with other specified complication: Secondary | ICD-10-CM | POA: Diagnosis not present

## 2019-05-14 DIAGNOSIS — I6522 Occlusion and stenosis of left carotid artery: Secondary | ICD-10-CM

## 2019-05-14 DIAGNOSIS — I10 Essential (primary) hypertension: Secondary | ICD-10-CM

## 2019-05-14 DIAGNOSIS — G8929 Other chronic pain: Secondary | ICD-10-CM

## 2019-05-14 DIAGNOSIS — M5136 Other intervertebral disc degeneration, lumbar region: Secondary | ICD-10-CM | POA: Insufficient documentation

## 2019-05-14 DIAGNOSIS — E119 Type 2 diabetes mellitus without complications: Secondary | ICD-10-CM | POA: Diagnosis not present

## 2019-05-14 DIAGNOSIS — Z794 Long term (current) use of insulin: Secondary | ICD-10-CM

## 2019-05-14 MED ORDER — METFORMIN HCL 1000 MG PO TABS: 500.0000 mg | ORAL_TABLET | Freq: Two times a day (BID) | ORAL | 1 refills | Status: DC

## 2019-05-14 MED ORDER — LINAGLIPTIN 5 MG PO TABS: 5.0000 mg | ORAL_TABLET | Freq: Every day | ORAL | 3 refills | Status: DC

## 2019-05-14 MED ORDER — TRAMADOL HCL 50 MG PO TABS: 50.0000 mg | ORAL_TABLET | Freq: Every evening | ORAL | 0 refills | Status: DC | PRN

## 2019-05-14 NOTE — Assessment & Plan Note (Signed)
Ultram at bedtime Obtain MRI from orthopedics Continue to hold epidurals due to DM

## 2019-05-14 NOTE — Assessment & Plan Note (Signed)
Diabetes is uncontrolled although improved some from last check.  Goal is A1c at least less than 8%.  She feels better when her fasting blood sugars are about 150.  She feels like the metformin does give her GI upset I will decrease her metformin to 500 mg twice a day.  Continue Tresiba 25 units.  I have given her samples of Tradjenta 5mg  daily to try

## 2019-05-14 NOTE — Patient Instructions (Addendum)
Ultram at bedtime  Decrease metformin to  500mg  twice a day  Start  tradjenta 5mg  once a day  Stop the novolog injections  Continue Tresiba 25mg   F/U 3 months for Physical with PAP

## 2019-05-14 NOTE — Assessment & Plan Note (Signed)
Blood pressure looks okay no change in medication.  She is on ACE inhibitor.

## 2019-05-14 NOTE — Progress Notes (Signed)
Subjective:    Patient ID: Heather Sloan, female    DOB: October 10, 1966, 53 y.o.   MRN: [id number]  Patient presents for Follow-up (has had labs)    Pt here to f/u chronic medical problems.  I last saw her in March 2019.  She was seen in the fall by my PA for headache there was concern for stroke symptoms at that time.  She did have complete evaluation by neurology as well as evaluation in the hospital.  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  She did have echo done which was normal she had mild plaque buildup in her left carotid less than 39%.  She is on statin drug.  She also has known chronic back pain hip pain right foot pain which she has had 2 surgeries on.  She was seen by orthopedics for her ongoing hip and knee pain she was having numbness going down her right leg.  Her imaging was negative for the knees and the hips thought that this was secondary to her back.  She was seen by Dr. Shon BatonBrooks thinks that she did have an MRI I do not see that on file.  They were planning for epidural injections however her blood sugars were very high at the time so this could not be done.  She was started on 300 mg of gabapentin 3 times a day but she continues to have back pain.  With her job she does a lot of pushing pulling at work.  She also works in very extreme temperatures including the heat right now.  At bedtime she still has difficulty getting comfortable secondary to her back pain is requesting something to help with the pain.  She has been trying to avoid NSAIDs in the setting of her GERD.    DM- on Tresiba 25 units, A1C  8.4/5, Novolog 10 units, has blood sugar dropped at 75 at work around 9am,  Fasting blood sugars are high 130-180  Metformin 1000mg  twice a day but this gives her GI upset. Also note she has not been taking the NovoLog 10 units with meals skips this very often if she does not eat regularly.     Taking ibuprofen caused GI upset   Fasting labs reviewed at bedside        Review Of Systems:  GEN- denies fatigue, fever, weight loss,weakness, recent illness HEENT- denies eye drainage, change in vision, nasal discharge, CVS- denies chest pain, palpitations RESP- denies SOB, cough, wheeze ABD- denies N/V, change in stools, abd pain GU- denies dysuria, hematuria, dribbling, incontinence MSK- + joint pain, muscle aches, injury Neuro- denies headache, dizziness, syncope, seizure activity       Objective:    BP 138/78   Pulse 68   Temp 98.5 F (36.9 C) (Oral)   Resp 14   Ht 5' 5.5" (1.664 m)   Wt 185 lb (83.9 kg)   LMP 09/02/2011 (Approximate)   SpO2 96%   BMI 30.32 kg/m  GEN- NAD, alert and oriented x3 HEENT- PERRL, EOMI, non injected sclera, pink conjunctiva, MMM, oropharynx clear Neck- Supple, no thyromegaly CVS- RRR, no murmur RESP-CTAB ABD-NABS,soft,NT,ND EXT- No edema,right ankle brace- healed surgical sites Pulses- Radial, DP- 2+        Assessment & Plan:      Problem List Items Addressed This Visit      Unprioritized   Carotid artery disease (HCC)   Chronic foot pain, right   Relevant Medications   traMADol (ULTRAM) 50 MG  tablet   Other Relevant Orders   HM DIABETES FOOT EXAM (Completed)   DDD (degenerative disc disease), lumbar    Ultram at bedtime Obtain MRI from orthopedics Continue to hold epidurals due to DM      Relevant Medications   traMADol (ULTRAM) 50 MG tablet   RESOLVED: Hyperlipidemia   Hyperlipidemia associated with type 2 diabetes mellitus ([place])    Lipids are at goal continue statin drug.      Relevant Medications   linagliptin (TRADJENTA) 5 MG TABS tablet   metFORMIN (GLUCOPHAGE) 1000 MG tablet   Hypertension - Primary    Blood pressure looks okay no change in medication.  She is on ACE inhibitor.      Type 2 diabetes mellitus without complications ([place])    Diabetes is uncontrolled although improved some from last check.  Goal is A1c at least less than 8%.  She feels better when her fasting  blood sugars are about 150.  She feels like the metformin does give her GI upset I will decrease her metformin to 500 mg twice a day.  Continue Tresiba 25 units.  I have given her samples of Tradjenta 5mg  daily to try        Relevant Medications   linagliptin (TRADJENTA) 5 MG TABS tablet   metFORMIN (GLUCOPHAGE) 1000 MG tablet      Note: This dictation was prepared with Dragon dictation along with smaller phrase technology. Any transcriptional errors that result from this process are unintentional.

## 2019-05-14 NOTE — Assessment & Plan Note (Signed)
Lipids are at goal continue statin drug.

## 2019-05-15 ENCOUNTER — Telehealth: Payer: Self-pay | Admitting: *Deleted

## 2019-05-15 NOTE — Telephone Encounter (Signed)
Received call from patient.   Reports that she forgot to request new prescription for CPAP supplies (mask and tubing) for [facility].   Ok to order?

## 2019-05-15 NOTE — Telephone Encounter (Signed)
Okay to order?

## 2019-05-15 NOTE — Telephone Encounter (Signed)
Prescription faxed to [facility] [phone]~ telephone/ [phone]~ fax.

## 2019-05-15 NOTE — Telephone Encounter (Signed)
Okay to bring FMLA/form, I will sign off on reduced hours Pt husband is to bring form in

## 2019-05-15 NOTE — Telephone Encounter (Signed)
Received call from patient.   Reports that she is sending FMLA forms to office.   Inquired for more information.   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 Hours of Work: M-F, 7-3pm.   Reason FMLA requested: D/T COVID, hygiene products are in more demand and plant is struggling to keep up. Those who are working are being asked to increase hours to 12hr shifts each day from 8hrs shifts. Patient states that due to her DM, she cannot work 12hrs and keep CBG under control. Also reports that DDD causes pain after 8 hours and she can't make it through 12.  Advised that she may not require FMLA, but some form of disability form, but patient states that she was advised by HR to file FMLA.  Verbalized that fee may be charged and is per provider prerogative.   MD please advise.

## 2019-05-16 ENCOUNTER — Other Ambulatory Visit: Payer: Self-pay | Admitting: Family Medicine

## 2019-05-17 ENCOUNTER — Telehealth: Payer: Self-pay | Admitting: Family Medicine

## 2019-05-17 NOTE — Telephone Encounter (Signed)
Received FMLA forms for reduced schedule.   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 Hours of Work: M-F, 7-3pm.   Reason FMLA requested: D/T COVID, hygiene products are in more demand and plant is struggling to keep up. Those who are working are being asked to increase hours to 12hr shifts each day from 8hrs shifts. Patient states that due to her DM, she cannot work 12hrs and keep CBG under control. Also reports that DDD causes pain after 8 hours and she can't make it through 12.  Verbalized that fee may be charged and is per provider prerogative.   Forms routed to provider.

## 2019-05-17 NOTE — Telephone Encounter (Signed)
Received fmla paperwork via walkin on 05-17-19  Will route to christina

## 2019-05-22 ENCOUNTER — Telehealth: Payer: Self-pay | Admitting: *Deleted

## 2019-05-22 MED ORDER — SITAGLIPTIN PHOSPHATE 100 MG PO TABS: 100.0000 mg | ORAL_TABLET | Freq: Every day | ORAL | 3 refills | Status: DC

## 2019-05-22 NOTE — Telephone Encounter (Signed)
Prescription sent to pharmacy.   Call placed to patient and patient made aware per VM. 

## 2019-05-22 NOTE — Telephone Encounter (Signed)
Received fax requesting alternative to [place].   Reports that Januvia and Onglyza are preferred.   MD please advise.

## 2019-05-22 NOTE — Telephone Encounter (Signed)
Change to [place]  100mg  once a day

## 2019-05-23 NOTE — Telephone Encounter (Signed)
Received completed forms from provider.   Simple charge per provider. Patient contacted to pay fee.

## 2019-06-11 ENCOUNTER — Other Ambulatory Visit: Payer: Self-pay | Admitting: *Deleted

## 2019-06-11 MED ORDER — TRAMADOL HCL 50 MG PO TABS: 50.0000 mg | ORAL_TABLET | Freq: Every evening | ORAL | 1 refills | Status: DC | PRN

## 2019-06-11 MED ORDER — MONTELUKAST SODIUM 10 MG PO TABS: 10.0000 mg | ORAL_TABLET | Freq: Every day | ORAL | 3 refills | Status: DC

## 2019-06-11 NOTE — Telephone Encounter (Signed)
Received call from patient.   Requested refill on Tramadol and Singulair.   Prescription sent to pharmacy for Singulair.   Ok to refill??  Last office visit/ refill 05/14/2019.

## 2019-06-25 ENCOUNTER — Other Ambulatory Visit: Payer: Self-pay | Admitting: Family Medicine

## 2019-06-25 ENCOUNTER — Other Ambulatory Visit: Payer: Self-pay | Admitting: *Deleted

## 2019-06-25 DIAGNOSIS — Z20822 Contact with and (suspected) exposure to covid-19: Secondary | ICD-10-CM

## 2019-06-26 LAB — NOVEL CORONAVIRUS, NAA: SARS-CoV-2, NAA: NOT DETECTED

## 2019-07-25 ENCOUNTER — Other Ambulatory Visit: Payer: Self-pay

## 2019-07-25 ENCOUNTER — Encounter: Payer: Self-pay | Admitting: Family Medicine

## 2019-07-25 ENCOUNTER — Ambulatory Visit: Payer: BC Managed Care – PPO | Admitting: Family Medicine

## 2019-07-25 VITALS — BP 132/82 | HR 82 | Temp 98.8°F | Resp 14 | Ht 65.5 in | Wt 191.0 lb

## 2019-07-25 DIAGNOSIS — Z23 Encounter for immunization: Secondary | ICD-10-CM | POA: Diagnosis not present

## 2019-07-25 DIAGNOSIS — I10 Essential (primary) hypertension: Secondary | ICD-10-CM

## 2019-07-25 DIAGNOSIS — M7989 Other specified soft tissue disorders: Secondary | ICD-10-CM | POA: Diagnosis not present

## 2019-07-25 DIAGNOSIS — G4733 Obstructive sleep apnea (adult) (pediatric): Secondary | ICD-10-CM

## 2019-07-25 DIAGNOSIS — E119 Type 2 diabetes mellitus without complications: Secondary | ICD-10-CM | POA: Diagnosis not present

## 2019-07-25 DIAGNOSIS — Z794 Long term (current) use of insulin: Secondary | ICD-10-CM

## 2019-07-25 MED ORDER — DICLOFENAC SODIUM 75 MG PO TBEC: 75.0000 mg | DELAYED_RELEASE_TABLET | Freq: Two times a day (BID) | ORAL | 1 refills | Status: DC

## 2019-07-25 NOTE — Progress Notes (Signed)
Subjective:    Patient ID: Heather Sloan, female    DOB: Sep 26, 1966, 53 y.o.   MRN: [id number]  Patient presents for Notes for CPAP ([facility] [phone]~ telephone/ [phone]~ fax- need notes stating that pt still uses and benefits from use of CPAP) and Foot Pain (L foot pain in toe- red and swollen- CBG rinning high) Patient here secondary to need for her CPAP supplies however she is due for diabetes check.  Using CPAP every night, at least 6-8 hours, cant sleep without out, has severe fatigue, feels exhausted if she doesn't use CPAP .  New mask hose reservoir applies Last Sleep study  2017in [place]  DM last A1C 8.4 , CBG spiked to  400 when her foot swelled up last week   Tresiba  25 units , Januvia Metformin , she took Novolog 10 units she had left at home and CBG came down to 190     Left foot- base of left toe became red, used ultram which helped a little  Ibuprofen didn't help ,wants to try diclofenac this tends to work better for her for any joint pain  redness is gone, never saw any bite or drainage or tear in skin     [place]  Dr. Teressa Senter  Review Of Systems:  GEN- denies fatigue, fever, weight loss,weakness, recent illness HEENT- denies eye drainage, change in vision, nasal discharge, CVS- denies chest pain, palpitations RESP- denies SOB, cough, wheeze ABD- denies N/V, change in stools, abd pain GU- denies dysuria, hematuria, dribbling, incontinence MSK- + joint pain, muscle aches, injury Neuro- denies headache, dizziness, syncope, seizure activity       Objective:    BP 132/82   Pulse 82   Temp 98.8 F (37.1 C) (Oral)   Resp 14   Ht 5' 5.5" (1.664 m)   Wt 191 lb (86.6 kg)   LMP 09/02/2011 (Approximate)   SpO2 95%   BMI 31.30 kg/m  GEN- NAD, alert and oriented x3 HEENT- PERRL, EOMI, non injected sclera, pink conjunctiva, MMM, oropharynx clear Neck- Supple, no thyromegaly CVS- RRR, no murmur RESP-CTAB ABD-NABS,soft,NT,ND EXT- No edema, left foot  TTP blow right great toe,  Mild swelling, no erythema, no warmth, FROM Ankle/toes Pulses- Radial, DP- 2+        Assessment & Plan:      Problem List Items Addressed This Visit      Unprioritized   Hypertension    Controlled no changes       OSA (obstructive sleep apnea)    She continues to benefit from CPAP therapy.  She needs to continue with her CPAP to control her sleep apnea symptoms which if untreated can lead to worsening heart disease or lung disease. Written a prescription for her CPAP supplies       Type 2 diabetes mellitus without complications (HCC) - Primary    Uncontrolled DM, with recent spike, obtain A1C goal is less than 8% With her recent elevated levels expect it will be higher than this Continue tresiba, oral medications Titrate her insulin as needed.  Also keep her fastings around 150 as she tolerates this better.   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 Any transcriptional errors that result from this process are unintentional.

## 2019-07-25 NOTE — Patient Instructions (Signed)
F/U as previous  We will call with lab results  

## 2019-07-25 NOTE — Assessment & Plan Note (Signed)
Controlled no changes 

## 2019-07-25 NOTE — Assessment & Plan Note (Addendum)
Uncontrolled DM, with recent spike, obtain A1C goal is less than 8% With her recent elevated levels expect it will be higher than this Continue tresiba, oral medications Titrate her insulin as needed.  Also keep her fastings around 150 as she tolerates this better.   In the foot swelling I do not see any signs of cellulitis today she does have some tenderness at the base of the toe this could possibly be gout,check uric acid level, given diclofenac, she has ultram

## 2019-07-25 NOTE — Assessment & Plan Note (Signed)
She continues to benefit from CPAP therapy.  She needs to continue with her CPAP to control her sleep apnea symptoms which if untreated can lead to worsening heart disease or lung disease. Written a prescription for her CPAP supplies

## 2019-07-26 LAB — CBC WITH DIFFERENTIAL/PLATELET
Absolute Monocytes: 1294 cells/uL — ABNORMAL HIGH (ref 200–950)
Basophils Absolute: 77 cells/uL (ref 0–200)
Basophils Relative: 0.5 %
Eosinophils Absolute: 447 cells/uL (ref 15–500)
Eosinophils Relative: 2.9 %
HCT: 36.2 % (ref 35.0–45.0)
Hemoglobin: 12.4 g/dL (ref 11.7–15.5)
Lymphs Abs: 4312 cells/uL — ABNORMAL HIGH (ref 850–3900)
MCH: 31.2 pg (ref 27.0–33.0)
MCHC: 34.3 g/dL (ref 32.0–36.0)
MCV: 91 fL (ref 80.0–100.0)
MPV: 9.5 fL (ref 7.5–12.5)
Monocytes Relative: 8.4 %
Neutro Abs: 9271 cells/uL — ABNORMAL HIGH (ref 1500–7800)
Neutrophils Relative %: 60.2 %
Platelets: 304 10*3/uL (ref 140–400)
RBC: 3.98 10*6/uL (ref 3.80–5.10)
RDW: 11.7 % (ref 11.0–15.0)
Total Lymphocyte: 28 %
WBC: 15.4 10*3/uL — ABNORMAL HIGH (ref 3.8–10.8)

## 2019-07-26 LAB — COMPREHENSIVE METABOLIC PANEL
AG Ratio: 1.7 (calc) (ref 1.0–2.5)
ALT: 17 U/L (ref 6–29)
AST: 13 U/L (ref 10–35)
Albumin: 4.2 g/dL (ref 3.6–5.1)
Alkaline phosphatase (APISO): 60 U/L (ref 37–153)
BUN: 14 mg/dL (ref 7–25)
CO2: 23 mmol/L (ref 20–32)
Calcium: 9.4 mg/dL (ref 8.6–10.4)
Chloride: 106 mmol/L (ref 98–110)
Creat: 0.84 mg/dL (ref 0.50–1.05)
Globulin: 2.5 g/dL (calc) (ref 1.9–3.7)
Glucose, Bld: 171 mg/dL — ABNORMAL HIGH (ref 65–99)
Potassium: 4.1 mmol/L (ref 3.5–5.3)
Sodium: 136 mmol/L (ref 135–146)
Total Bilirubin: 0.3 mg/dL (ref 0.2–1.2)
Total Protein: 6.7 g/dL (ref 6.1–8.1)

## 2019-07-26 LAB — HEMOGLOBIN A1C
Hgb A1c MFr Bld: 8.7 % of total Hgb — ABNORMAL HIGH (ref <5.7)
Mean Plasma Glucose: 203 (calc)
eAG (mmol/L): 11.2 (calc)

## 2019-07-26 LAB — URIC ACID: Uric Acid, Serum: 3.8 mg/dL (ref 2.5–7.0)

## 2019-07-27 ENCOUNTER — Other Ambulatory Visit: Payer: Self-pay | Admitting: *Deleted

## 2019-07-27 MED ORDER — TRESIBA FLEXTOUCH 100 UNIT/ML ~~LOC~~ SOPN: 28.0000 [IU] | PEN_INJECTOR | Freq: Every day | SUBCUTANEOUS | 0 refills | Status: DC

## 2019-07-27 MED ORDER — CEPHALEXIN 500 MG PO CAPS: 500.0000 mg | ORAL_CAPSULE | Freq: Two times a day (BID) | ORAL | 0 refills | Status: AC

## 2019-08-05 ENCOUNTER — Other Ambulatory Visit: Payer: Self-pay | Admitting: Family Medicine

## 2019-08-30 ENCOUNTER — Other Ambulatory Visit: Payer: Self-pay | Admitting: Family Medicine

## 2019-09-06 ENCOUNTER — Other Ambulatory Visit: Payer: Self-pay

## 2019-09-07 ENCOUNTER — Ambulatory Visit (INDEPENDENT_AMBULATORY_CARE_PROVIDER_SITE_OTHER): Payer: BC Managed Care – PPO | Admitting: Family Medicine

## 2019-09-07 ENCOUNTER — Encounter: Payer: Self-pay | Admitting: Family Medicine

## 2019-09-07 VITALS — BP 128/68 | HR 82 | Temp 98.1°F | Resp 14 | Ht 65.5 in | Wt 195.0 lb

## 2019-09-07 DIAGNOSIS — R21 Rash and other nonspecific skin eruption: Secondary | ICD-10-CM | POA: Diagnosis not present

## 2019-09-07 DIAGNOSIS — Z0000 Encounter for general adult medical examination without abnormal findings: Secondary | ICD-10-CM

## 2019-09-07 DIAGNOSIS — Z720 Tobacco use: Secondary | ICD-10-CM

## 2019-09-07 DIAGNOSIS — Z124 Encounter for screening for malignant neoplasm of cervix: Secondary | ICD-10-CM

## 2019-09-07 DIAGNOSIS — Z794 Long term (current) use of insulin: Secondary | ICD-10-CM

## 2019-09-07 DIAGNOSIS — E119 Type 2 diabetes mellitus without complications: Secondary | ICD-10-CM

## 2019-09-07 DIAGNOSIS — Z0001 Encounter for general adult medical examination with abnormal findings: Secondary | ICD-10-CM | POA: Diagnosis not present

## 2019-09-07 DIAGNOSIS — E669 Obesity, unspecified: Secondary | ICD-10-CM

## 2019-09-07 MED ORDER — CLOBETASOL PROPIONATE 0.05 % EX CREA: 1.0000 "application " | TOPICAL_CREAM | Freq: Two times a day (BID) | CUTANEOUS | 1 refills | Status: AC

## 2019-09-07 MED ORDER — SHINGRIX 50 MCG/0.5ML IM SUSR: 0.5000 mL | Freq: Once | INTRAMUSCULAR | 1 refills | Status: AC

## 2019-09-07 NOTE — Progress Notes (Signed)
Subjective:    Patient ID: Heather Sloan, female    DOB: 1966/06/10, 53 y.o.   MRN: [id number]  Patient presents for Gynecologic Exam (is not fasting) and Joint Pain (has har knots to joints on hands and elbows) Patient here for CPE she is due for Pap smear.  Last Pap smear done in 2014 based on our records  She is overdue for mammogram- states she had done 2 years ago, wants to defer until next fall  Colonoscopy up-to-date 2017  Immunizations tetanus up-to-date, flu/pneumonia up-to-date discussed shingles vaccine   Diabetes mellitus uncontrolled she is on Januvia, Metformin and Tresiba 28 units.  Her last A1c was 8.7% 1 month ago.  She is due for lipid panel Currently on simvastatin 40 mg at bedtime she is on ACE inhibitor also helps control her blood pressure.  CBG fasting yesterday 181  ( 130-180)  Nov 17th at 4pm  Has eye doctor appointment   Erythema across knuckes is spreading has been present for > 3 months, noitching, also getting some on elbows, will get callused lesions that may open up     Review Of Systems:  GEN- denies fatigue, fever, weight loss,weakness, recent illness HEENT- denies eye drainage, change in vision, nasal discharge, CVS- denies chest pain, palpitations RESP- denies SOB, cough, wheeze ABD- denies N/V, change in stools, abd pain GU- denies dysuria, hematuria, dribbling, incontinence MSK- denies joint pain, muscle aches, injury Neuro- denies headache, dizziness, syncope, seizure activity       Objective:    BP 128/68   Pulse 82   Temp 98.1 F (36.7 C) (Temporal)   Resp 14   Ht 5' 5.5" (1.664 m)   Wt 195 lb (88.5 kg)   LMP 09/02/2011 (Approximate)   SpO2 98%   BMI 31.96 kg/m  GEN- NAD, alert and oriented x3 HEENT- PERRL, EOMI, non injected sclera, pink conjunctiva, MMM, oropharynx clear Neck- Supple, no thyromegaly Breast- normal symmetry, no nipple inversion,no nipple drainage, no nodules or lumps felt Nodes- no axillary nodes CVS- RRR,  no murmur RESP-CTAB ABD-NABS,soft,NT,ND GU- normal external genitalia, vaginal mucosa pink and moist, cervix visualized no growth, no blood form os, no discharge, no CMT, no ovarian masses, uterus normal size Rectum normal tone,  No external lesions, FOBT negative  Skin- erythematous raised lesions across knuckles, small cyst on DIP 3rd digit left hand EXT- No edema,arthritic changes bilat hands  Pulses- Radial, DP- 2+        Assessment & Plan:      Problem List Items Addressed This Visit      Unprioritized   Obesity (BMI 30-39.9)   Tobacco use   Type 2 diabetes mellitus without complications (HCC)    Improved fasting CBG, no change to [place] continue 28 units Continue oral meds Plan to checklipids at next visit fasting        Other Visit Diagnoses    Routine general medical examination at a health care facility    -  Primary   CPE done, PAP Smear, pt declines mammogram this year, shingrix to pharmacy   Cervical cancer screening       Relevant Orders   Pap IG w/ reflex to HPV when ASC-[facility] [employer])   Skin rash       apperance of an ezematous rash but also has nodules characteristic of OA of hands, trial of clobetasol cream , may need derm       Note: This dictation was prepared with Dragon dictation along with smaller  Company secretary. Any transcriptional errors that result from this process are unintentional.

## 2019-09-07 NOTE — Assessment & Plan Note (Signed)
Improved fasting CBG, no change to [place] continue 28 units Continue oral meds Plan to checklipids at next visit fasting

## 2019-09-07 NOTE — Patient Instructions (Signed)
Shingles vaccine sent to pharmacy  F/U 3 months

## 2019-09-10 LAB — PAP IG W/ RFLX HPV ASCU

## 2019-09-14 ENCOUNTER — Other Ambulatory Visit: Payer: Self-pay | Admitting: Family Medicine

## 2019-09-18 LAB — HM DIABETES EYE EXAM

## 2019-10-07 ENCOUNTER — Other Ambulatory Visit: Payer: Self-pay | Admitting: Family Medicine

## 2019-10-16 ENCOUNTER — Other Ambulatory Visit: Payer: Self-pay | Admitting: Family Medicine

## 2019-11-12 ENCOUNTER — Telehealth: Payer: Self-pay | Admitting: *Deleted

## 2019-11-12 NOTE — Telephone Encounter (Signed)
Received call from patient. Reports that insurance has changed and she requires PA for Januvia.   [facility] Member ID: [id number] RxBIN: [id number] RxGRP: [id number] RxPCN: [id number]

## 2019-11-17 ENCOUNTER — Other Ambulatory Visit: Payer: Self-pay | Admitting: Family Medicine

## 2019-11-21 NOTE — Telephone Encounter (Signed)
PA submitted.   Dx: E11.9- DM.   Your information has been sent to [facility].

## 2019-11-27 NOTE — Telephone Encounter (Signed)
Change to Tradjenta 5mg once a day  

## 2019-11-27 NOTE — Telephone Encounter (Signed)
Received PA determination.   PA denied. The requested medication is not a covered benefit and excluded from coverage in accordance with the terms and conditions of your plan benefit.  Formulary alternatives include: Onglyza Edwyna Shell  MD please advise.

## 2019-11-28 MED ORDER — LINAGLIPTIN 5 MG PO TABS: 5.0000 mg | ORAL_TABLET | Freq: Every day | ORAL | 3 refills | Status: DC

## 2019-11-28 NOTE — Telephone Encounter (Signed)
Call placed to patient and patient made aware.   Prescription sent to pharmacy.  

## 2019-12-10 ENCOUNTER — Ambulatory Visit (INDEPENDENT_AMBULATORY_CARE_PROVIDER_SITE_OTHER): Payer: 59 | Admitting: Family Medicine

## 2019-12-10 ENCOUNTER — Encounter: Payer: Self-pay | Admitting: Family Medicine

## 2019-12-10 ENCOUNTER — Other Ambulatory Visit: Payer: Self-pay

## 2019-12-10 VITALS — BP 130/86 | HR 68 | Temp 98.1°F | Resp 16 | Ht 65.5 in | Wt 197.0 lb

## 2019-12-10 DIAGNOSIS — E1169 Type 2 diabetes mellitus with other specified complication: Secondary | ICD-10-CM | POA: Diagnosis not present

## 2019-12-10 DIAGNOSIS — I10 Essential (primary) hypertension: Secondary | ICD-10-CM | POA: Diagnosis not present

## 2019-12-10 DIAGNOSIS — I6522 Occlusion and stenosis of left carotid artery: Secondary | ICD-10-CM

## 2019-12-10 DIAGNOSIS — E119 Type 2 diabetes mellitus without complications: Secondary | ICD-10-CM

## 2019-12-10 DIAGNOSIS — E785 Hyperlipidemia, unspecified: Secondary | ICD-10-CM

## 2019-12-10 DIAGNOSIS — M5136 Other intervertebral disc degeneration, lumbar region: Secondary | ICD-10-CM

## 2019-12-10 DIAGNOSIS — Z794 Long term (current) use of insulin: Secondary | ICD-10-CM

## 2019-12-10 MED ORDER — METHOCARBAMOL 500 MG PO TABS: 500.0000 mg | ORAL_TABLET | Freq: Three times a day (TID) | ORAL | 1 refills | Status: DC | PRN

## 2019-12-10 MED ORDER — DICLOFENAC SODIUM 75 MG PO TBEC: 75.0000 mg | DELAYED_RELEASE_TABLET | Freq: Two times a day (BID) | ORAL | 3 refills | Status: DC

## 2019-12-10 NOTE — Progress Notes (Signed)
Subjective:    Patient ID: Heather Sloan, female    DOB: Jan 16, 1966, 54 y.o.   MRN: [id number]  Patient presents for Follow-up (is not fasing)  Patient here to follow-up chronic medical problems.  Medications reviewed.  Diabetes mellitus due to insurance change she was switched to [place] from [place].  She is still on Metformin 500 mg twice a day she tolerates this dose.  She is also on Tresiba 28 units   Last A1C 8.7% in Sept  due for urine micro   CBG- lowest  99, fasting are typically 125-130  She is taking cinnamon  Hypertension she is taking lisinopril as prescribed  Hyperlipidemia- she is taking simvastatin 40 mg as prescribed  GERD-Omeprazole 40 mg daily  Left carotid artery disease, last US done in Dec 2019, due for repeat    Chronic pain, she would like to try relaxer instead of tramadol at bedtime due to spasms in her legs. Review Of Systems:  GEN- denies fatigue, fever, weight loss,weakness, recent illness HEENT- denies eye drainage, change in vision, nasal discharge, CVS- denies chest pain, palpitations RESP- denies SOB, cough, wheeze ABD- denies N/V, change in stools, abd pain GU- denies dysuria, hematuria, dribbling, incontinence MSK- denies joint pain, +muscle aches, injury Neuro- denies headache, dizziness, syncope, seizure activity       Objective:    BP 130/86   Pulse 68   Temp 98.1 F (36.7 C) (Temporal)   Resp 16   Ht 5' 5.5" (1.664 m)   Wt 197 lb (89.4 kg)   LMP 09/02/2011 (Approximate)   SpO2 98%   BMI 32.28 kg/m  GEN- NAD, alert and oriented x3 HEENT- PERRL, EOMI, non injected sclera, pink conjunctiva, MMM, oropharynx clear Neck- Supple, no thyromegaly CVS- RRR, no murmur RESP-CTAB ABD-NABS,soft,NT,ND EXT- No edema Pulses- Radial, DP- 2+        Assessment & Plan:      Problem List Items Addressed This Visit      Unprioritized   Carotid artery disease (HCC)    Plan to schedule at next OV in June       DDD (degenerative  disc disease), lumbar    Chronic back pain foot pain leg pain.  We will try her on Robaxin she will use this instead of the tramadol.  She uses Voltaren orally as needed for her foot pain.      Relevant Medications   ibuprofen (ADVIL) 200 MG tablet   diclofenac (VOLTAREN) 75 MG EC tablet   methocarbamol (ROBAXIN) 500 MG tablet   Hyperlipidemia associated with type 2 diabetes mellitus (HCC)    LDL at goal.  We will recheck this at the next visit      Hypertension - Primary    Pressure is well controlled no change in medication.      Relevant Orders   CBC with Differential/Platelet   Comprehensive metabolic panel   Type 2 diabetes mellitus without complications (HCC)    Diabetes uncontrolled: A1c less than 7%.  Continue Tresiba 28 units, Tradjenta and Metformin 500 mg twice a day.  We will recheck her labs today along with renal function.  On statin drug and ACE inhibitor.      Relevant Orders   Comprehensive metabolic panel   Hemoglobin A1c   HM DIABETES FOOT EXAM (Completed)   Microalbumin / creatinine urine ratio      Note: This dictation was prepared with Dragon dictation along with smaller phrase technology. Any transcriptional errors that result from this process  are unintentional.

## 2019-12-10 NOTE — Assessment & Plan Note (Signed)
Diabetes uncontrolled: A1c less than 7%.  Continue Tresiba 28 units, Tradjenta and Metformin 500 mg twice a day.  We will recheck her labs today along with renal function.  On statin drug and ACE inhibitor.

## 2019-12-10 NOTE — Assessment & Plan Note (Signed)
LDL at goal.  We will recheck this at the next visit

## 2019-12-10 NOTE — Patient Instructions (Addendum)
We will call with lab results F/U 4 months  

## 2019-12-10 NOTE — Assessment & Plan Note (Signed)
Plan to schedule at next OV in June

## 2019-12-10 NOTE — Assessment & Plan Note (Signed)
Chronic back pain foot pain leg pain.  We will try her on Robaxin she will use this instead of the tramadol.  She uses Voltaren orally as needed for her foot pain.

## 2019-12-10 NOTE — Assessment & Plan Note (Signed)
Pressure is well controlled no change in medication. 

## 2019-12-11 LAB — COMPREHENSIVE METABOLIC PANEL
AG Ratio: 1.9 (calc) (ref 1.0–2.5)
ALT: 16 U/L (ref 6–29)
AST: 11 U/L (ref 10–35)
Albumin: 4.4 g/dL (ref 3.6–5.1)
Alkaline phosphatase (APISO): 50 U/L (ref 37–153)
BUN: 11 mg/dL (ref 7–25)
CO2: 26 mmol/L (ref 20–32)
Calcium: 9.8 mg/dL (ref 8.6–10.4)
Chloride: 103 mmol/L (ref 98–110)
Creat: 0.8 mg/dL (ref 0.50–1.05)
Globulin: 2.3 g/dL (calc) (ref 1.9–3.7)
Glucose, Bld: 119 mg/dL — ABNORMAL HIGH (ref 65–99)
Potassium: 4.4 mmol/L (ref 3.5–5.3)
Sodium: 136 mmol/L (ref 135–146)
Total Bilirubin: 0.3 mg/dL (ref 0.2–1.2)
Total Protein: 6.7 g/dL (ref 6.1–8.1)

## 2019-12-11 LAB — CBC WITH DIFFERENTIAL/PLATELET
Absolute Monocytes: 1074 cells/uL — ABNORMAL HIGH (ref 200–950)
Basophils Absolute: 105 cells/uL (ref 0–200)
Basophils Relative: 0.8 %
Eosinophils Absolute: 472 cells/uL (ref 15–500)
Eosinophils Relative: 3.6 %
HCT: 37.9 % (ref 35.0–45.0)
Hemoglobin: 12.9 g/dL (ref 11.7–15.5)
Lymphs Abs: 4899 cells/uL — ABNORMAL HIGH (ref 850–3900)
MCH: 30.9 pg (ref 27.0–33.0)
MCHC: 34 g/dL (ref 32.0–36.0)
MCV: 90.7 fL (ref 80.0–100.0)
MPV: 9.4 fL (ref 7.5–12.5)
Monocytes Relative: 8.2 %
Neutro Abs: 6550 cells/uL (ref 1500–7800)
Neutrophils Relative %: 50 %
Platelets: 338 10*3/uL (ref 140–400)
RBC: 4.18 10*6/uL (ref 3.80–5.10)
RDW: 12.1 % (ref 11.0–15.0)
Total Lymphocyte: 37.4 %
WBC: 13.1 10*3/uL — ABNORMAL HIGH (ref 3.8–10.8)

## 2019-12-11 LAB — HEMOGLOBIN A1C
Hgb A1c MFr Bld: 7.4 % of total Hgb — ABNORMAL HIGH (ref <5.7)
Mean Plasma Glucose: 166 (calc)
eAG (mmol/L): 9.2 (calc)

## 2019-12-11 LAB — MICROALBUMIN / CREATININE URINE RATIO
Creatinine, Urine: 61 mg/dL (ref 20–275)
Microalb Creat Ratio: 8 mcg/mg creat (ref <30)
Microalb, Ur: 0.5 mg/dL

## 2019-12-25 ENCOUNTER — Other Ambulatory Visit: Payer: Self-pay | Admitting: Family Medicine

## 2019-12-26 ENCOUNTER — Telehealth: Payer: Self-pay | Admitting: *Deleted

## 2019-12-26 MED ORDER — LANTUS SOLOSTAR 100 UNIT/ML ~~LOC~~ SOPN: 25.0000 [IU] | PEN_INJECTOR | Freq: Every day | SUBCUTANEOUS | 11 refills | Status: DC

## 2019-12-26 NOTE — Telephone Encounter (Signed)
Prescription sent to pharmacy.   Call placed to patient and patient spouse Fayrene Fearing made aware.

## 2019-12-26 NOTE — Telephone Encounter (Signed)
Received request from pharmacy for PA on Tresiba.   Reports that formulary alternatives include Lantus and Toujeo.   MD please advise.

## 2019-12-26 NOTE — Telephone Encounter (Signed)
Change Tresiba to Lantus, same dose

## 2020-01-07 ENCOUNTER — Other Ambulatory Visit: Payer: Self-pay | Admitting: Family Medicine

## 2020-01-08 NOTE — Telephone Encounter (Signed)
Ok to refill??  Last office visit 12/10/2019.  Last refill 06/11/2019, #1 refill.

## 2020-01-22 ENCOUNTER — Other Ambulatory Visit: Payer: Self-pay | Admitting: Family Medicine

## 2020-02-15 ENCOUNTER — Other Ambulatory Visit: Payer: Self-pay | Admitting: Family Medicine

## 2020-04-04 ENCOUNTER — Other Ambulatory Visit: Payer: Self-pay | Admitting: Family Medicine

## 2020-04-09 ENCOUNTER — Ambulatory Visit: Payer: 59 | Admitting: Family Medicine

## 2020-04-13 ENCOUNTER — Other Ambulatory Visit: Payer: Self-pay | Admitting: Family Medicine

## 2020-04-14 ENCOUNTER — Other Ambulatory Visit: Payer: Self-pay

## 2020-04-14 ENCOUNTER — Ambulatory Visit: Payer: 59 | Admitting: Family Medicine

## 2020-04-14 ENCOUNTER — Encounter: Payer: Self-pay | Admitting: Family Medicine

## 2020-04-14 VITALS — BP 132/74 | HR 86 | Temp 98.2°F | Resp 14 | Ht 65.5 in | Wt 187.0 lb

## 2020-04-14 DIAGNOSIS — E119 Type 2 diabetes mellitus without complications: Secondary | ICD-10-CM

## 2020-04-14 DIAGNOSIS — I6522 Occlusion and stenosis of left carotid artery: Secondary | ICD-10-CM

## 2020-04-14 DIAGNOSIS — B351 Tinea unguium: Secondary | ICD-10-CM

## 2020-04-14 DIAGNOSIS — E785 Hyperlipidemia, unspecified: Secondary | ICD-10-CM

## 2020-04-14 DIAGNOSIS — I10 Essential (primary) hypertension: Secondary | ICD-10-CM | POA: Diagnosis not present

## 2020-04-14 DIAGNOSIS — Z794 Long term (current) use of insulin: Secondary | ICD-10-CM

## 2020-04-14 DIAGNOSIS — Z1231 Encounter for screening mammogram for malignant neoplasm of breast: Secondary | ICD-10-CM

## 2020-04-14 DIAGNOSIS — E1169 Type 2 diabetes mellitus with other specified complication: Secondary | ICD-10-CM

## 2020-04-14 NOTE — Assessment & Plan Note (Signed)
A1C improved at last check Continue insulin and oral therapy- metformin and tradjenta  Advised to check CBG regulary  On statin drug

## 2020-04-14 NOTE — Progress Notes (Signed)
Subjective:    Patient ID: Heather Sloan, female    DOB: 15-Feb-1966, 54 y.o.   MRN: [id number]  Patient presents for Follow-up (is not fasting) Patient here to follow-up chronic medical problems.  Medications reviewed  Her last visit she is due for carotid artery ultrasound due to carotid artery disease.  She is on statin drug.  Diabetes mellitus last A1c was 7.4% in Feb, she is currently taking Lantus 28 units, Tradjenta and Metformin 500 mg twice a day.  She is also taking cinnamon She has not been checking her CBG   she felt like she had a low sugar yesterday and ate donuts and CBG afterwards was 197 Due for lipids   She continues to have right elbow pain with some numbness and tingling in her first 3 digits.  She was diagnosed with carpal tunnel many years ago also has tennis elbow.  She uses a elbow sleeve which helps.  She is also on gabapentin.  She does not want to have this evaluated at this time.  More than FYI.  Chronic left great toenail pain she also has thickness of other toenails and pain in her right thumb with thickness.  She been using Penlac topical diagnosed with onychomycosis in the past.  She asked about oral drugs if she is not seeing any improvement in discomfort for the nail with topicals.   Continue to take blood pressure medicine as prescribed no side effects  Obstructive sleep apnea she is using her CPAP as prescribed  Right foot and ankle pain/ back pain  she has tramadol on hand also uses Robaxin and diclofenac as needed   Due for Mammogram   Review Of Systems:  GEN- denies fatigue, fever, weight loss,weakness, recent illness HEENT- denies eye drainage, change in vision, nasal discharge, CVS- denies chest pain, palpitations RESP- denies SOB, cough, wheeze ABD- denies N/V, change in stools, abd pain GU- denies dysuria, hematuria, dribbling, incontinence MSK- + joint pain, muscle aches, injury Neuro- denies headache, dizziness, syncope, seizure  activity       Objective:    BP 132/74   Pulse 86   Temp 98.2 F (36.8 C) (Temporal)   Resp 14   Ht 5' 5.5" (1.664 m)   Wt 187 lb (84.8 kg)   LMP 09/02/2011 (Approximate)   SpO2 96%   BMI 30.65 kg/m  GEN- NAD, alert and oriented x3 HEENT- PERRL, EOMI, non injected sclera, pink conjunctiva, MMM, oropharynx clear Neck- Supple,carotid bruit  CVS- RRR, no murmur RESP-CTAB ABD-NABS,soft,NT,ND Skin- thick right thumbnail, mild ttp and mild ingrown nail, no pus, Bilat great toenails thickened and yellowing and 2nd toe on left foot  EXT- No edema Pulses- Radial, DP- 2+        Assessment & Plan:      Problem List Items Addressed This Visit      Unprioritized   Carotid artery disease (HCC)    Recheck duplex, on statin drug       Relevant Orders   US Carotid Duplex Bilateral   Hyperlipidemia associated with type 2 diabetes mellitus (HCC)    Check direct LDL goal LDL less than 100, around 70 best with carotid artery disease  Regarding the fungus on nail, for thumbnail, mild ingrown nail, recommend she try soaking and lifting up nail with dental floss for now I am going to refer her to dermatology for treatment plan  If she does oral therapy she would need to come off statin and she has two many  high risk diseases to remain off statin for long periods of time       Hypertension - Primary    Controlled no changes       Relevant Orders   CBC with Differential/Platelet   Comprehensive metabolic panel   Type 2 diabetes mellitus without complications (HCC)    B1D improved at last check Continue insulin and oral therapy- metformin and tradjenta  Advised to check CBG regulary  On statin drug       Relevant Orders   CBC with Differential/Platelet   Hemoglobin A1c   Comprehensive metabolic panel   LDL Cholesterol, Direct    Other Visit Diagnoses    Onychomycosis fingernail/ toenails       Relevant Orders   Ambulatory referral to Dermatology   Encounter for  screening mammogram for malignant neoplasm of breast       Pt to schedule , given info   Relevant Orders   MM 3D SCREEN BREAST BILATERAL      Note: This dictation was prepared with Dragon dictation along with smaller phrase technology. Any transcriptional errors that result from this process are unintentional.

## 2020-04-14 NOTE — Assessment & Plan Note (Signed)
Check direct LDL goal LDL less than 100, around 70 best with carotid artery disease  Regarding the fungus on nail, for thumbnail, mild ingrown nail, recommend she try soaking and lifting up nail with dental floss for now I am going to refer her to dermatology for treatment plan  If she does oral therapy she would need to come off statin and she has two many high risk diseases to remain off statin for long periods of time

## 2020-04-14 NOTE — Assessment & Plan Note (Signed)
Recheck duplex, on statin drug

## 2020-04-14 NOTE — Assessment & Plan Note (Signed)
Controlled no changes 

## 2020-04-14 NOTE — Patient Instructions (Addendum)
Mammogram you can schedule [facility] for the carotid arteries  Referral to dermatology  F/U 4 months

## 2020-04-15 LAB — CBC WITH DIFFERENTIAL/PLATELET
Absolute Monocytes: 932 cells/uL (ref 200–950)
Basophils Absolute: 97 cells/uL (ref 0–200)
Basophils Relative: 0.8 %
Eosinophils Absolute: 387 cells/uL (ref 15–500)
Eosinophils Relative: 3.2 %
HCT: 38.2 % (ref 35.0–45.0)
Hemoglobin: 13.1 g/dL (ref 11.7–15.5)
Lymphs Abs: 4477 cells/uL — ABNORMAL HIGH (ref 850–3900)
MCH: 31.4 pg (ref 27.0–33.0)
MCHC: 34.3 g/dL (ref 32.0–36.0)
MCV: 91.6 fL (ref 80.0–100.0)
MPV: 9.5 fL (ref 7.5–12.5)
Monocytes Relative: 7.7 %
Neutro Abs: 6207 cells/uL (ref 1500–7800)
Neutrophils Relative %: 51.3 %
Platelets: 310 10*3/uL (ref 140–400)
RBC: 4.17 10*6/uL (ref 3.80–5.10)
RDW: 12.3 % (ref 11.0–15.0)
Total Lymphocyte: 37 %
WBC: 12.1 10*3/uL — ABNORMAL HIGH (ref 3.8–10.8)

## 2020-04-15 LAB — COMPREHENSIVE METABOLIC PANEL
AG Ratio: 1.7 (calc) (ref 1.0–2.5)
ALT: 28 U/L (ref 6–29)
AST: 19 U/L (ref 10–35)
Albumin: 4.3 g/dL (ref 3.6–5.1)
Alkaline phosphatase (APISO): 55 U/L (ref 37–153)
BUN: 18 mg/dL (ref 7–25)
CO2: 26 mmol/L (ref 20–32)
Calcium: 9.6 mg/dL (ref 8.6–10.4)
Chloride: 103 mmol/L (ref 98–110)
Creat: 0.99 mg/dL (ref 0.50–1.05)
Globulin: 2.5 g/dL (calc) (ref 1.9–3.7)
Glucose, Bld: 136 mg/dL — ABNORMAL HIGH (ref 65–99)
Potassium: 4.7 mmol/L (ref 3.5–5.3)
Sodium: 138 mmol/L (ref 135–146)
Total Bilirubin: 0.3 mg/dL (ref 0.2–1.2)
Total Protein: 6.8 g/dL (ref 6.1–8.1)

## 2020-04-15 LAB — HEMOGLOBIN A1C
Hgb A1c MFr Bld: 7.9 % of total Hgb — ABNORMAL HIGH (ref <5.7)
Mean Plasma Glucose: 180 (calc)
eAG (mmol/L): 10 (calc)

## 2020-04-15 LAB — LDL CHOLESTEROL, DIRECT: Direct LDL: 94 mg/dL (ref <100)

## 2020-04-17 ENCOUNTER — Other Ambulatory Visit: Payer: Self-pay | Admitting: Family Medicine

## 2020-05-13 ENCOUNTER — Ambulatory Visit (HOSPITAL_COMMUNITY)
Admission: RE | Admit: 2020-05-13 | Discharge: 2020-05-13 | Disposition: A | Discharge status: Home / Self Care | Payer: 59 | Source: Ambulatory Visit | Attending: Family Medicine | Admitting: Family Medicine

## 2020-05-13 ENCOUNTER — Other Ambulatory Visit: Payer: Self-pay

## 2020-05-13 DIAGNOSIS — I6522 Occlusion and stenosis of left carotid artery: Secondary | ICD-10-CM | POA: Insufficient documentation

## 2020-05-14 ENCOUNTER — Other Ambulatory Visit: Payer: Self-pay | Admitting: Family Medicine

## 2020-05-18 ENCOUNTER — Other Ambulatory Visit: Payer: Self-pay | Admitting: Family Medicine

## 2020-06-12 ENCOUNTER — Other Ambulatory Visit: Payer: Self-pay | Admitting: Family Medicine

## 2020-07-13 ENCOUNTER — Other Ambulatory Visit: Payer: Self-pay | Admitting: Family Medicine

## 2020-07-25 ENCOUNTER — Other Ambulatory Visit: Payer: Self-pay | Admitting: Family Medicine

## 2020-07-28 ENCOUNTER — Encounter: Payer: Self-pay | Admitting: Emergency Medicine

## 2020-07-28 ENCOUNTER — Other Ambulatory Visit: Payer: Self-pay

## 2020-07-28 ENCOUNTER — Ambulatory Visit
Admission: EM | Admit: 2020-07-28 | Discharge: 2020-07-28 | Disposition: A | Discharge status: Home / Self Care | Payer: 59 | Attending: Emergency Medicine | Admitting: Emergency Medicine

## 2020-07-28 DIAGNOSIS — S61551A Open bite of right wrist, initial encounter: Secondary | ICD-10-CM | POA: Diagnosis not present

## 2020-07-28 DIAGNOSIS — W5501XA Bitten by cat, initial encounter: Secondary | ICD-10-CM | POA: Diagnosis not present

## 2020-07-28 MED ORDER — AMOXICILLIN-POT CLAVULANATE 875-125 MG PO TABS
1.0000 | ORAL_TABLET | Freq: Two times a day (BID) | ORAL | 0 refills | Status: AC
Start: 2020-07-28 — End: 2020-08-07

## 2020-07-28 MED ORDER — TETANUS-DIPHTH-ACELL PERTUSSIS 5-2.5-18.5 LF-MCG/0.5 IM SUSP
0.5000 mL | Freq: Once | INTRAMUSCULAR | Status: AC
  Administered 2020-07-28: 0.5 mL via INTRAMUSCULAR

## 2020-07-28 NOTE — Discharge Instructions (Signed)
Tetanus updated Wash with warm water and mild soap Change dressing daily Augmentin prescribed.  Take as directed and to completion Follow up with PCP if symptoms persists Return or go to the ED if you have any new or worsening symptoms such as increasing redness, swelling, drainage, fever, chills, nausea, chest pain, SOB, etc...  

## 2020-07-28 NOTE — ED Triage Notes (Signed)
Got bit by a cat on Sunday to RT wrist. Area is red now and painful at times now. Had virtual visit and was told to come in to have provider look at the wound.

## 2020-07-28 NOTE — ED Provider Notes (Signed)
[facility]   [id number] 07/28/20 Arrival Time: 1739  CC: LACERATION  SUBJECTIVE:  Yazmine Sorey is a 54 y.o. female who presents with cat bite to RT wrist that occurred 1 day ago.  Cat bite to RT wrist.  Reports associated swelling.  Reports similar cat bites in the past.  Denies fever, chills, nausea, vomiting, redness, purulent drainage, decrease strength or sensation.   Td UTD: Unknown. Not concerned for rabies  ROS: As per HPI.  All other pertinent ROS negative.     Past Medical History:  Diagnosis Date  . Arthritis    "qwhere" (08/04/2018)  . Depression    hx  . Fatty liver disease, nonalcoholic    [place] 2013 [place], Hepatitis Labs negative  . Frequent sinus infections   . GERD (gastroesophageal reflux disease)    EGD March 2018, neg H pylori  . Hyperlipidemia   . Hypertension   . Migraine    "3-4/year now" (08/04/2018)  . OSA on CPAP   . Pneumonia 2006   "right after I had pneumonia shot"  . PVC (premature ventricular contraction)    Holter Monitor  2015  . Type II diabetes mellitus (HCC)    Past Surgical History:  Procedure Laterality Date  . FOOT TENDON SURGERY Right 03/2017   "took tendon out; moved tendon behind that tendon to put in it's place; cut heel off, moved heel over, put 3 inch screw in to hold it in place"  . LAPAROSCOPIC CHOLECYSTECTOMY  1990s  . THROAT SURGERY  2017   "thought I had an abcess; wasn't that; had acute peri-arthritis"   Allergies  Allergen Reactions  . Codeine Other (See Comments)    Passed out  . Bactrim [Sulfamethoxazole-Trimethoprim] Hives  . Wheat Extract Diarrhea and Other (See Comments)    Irritable bowel, bloating   No current facility-administered medications on file prior to encounter.   Current Outpatient Medications on File Prior to Encounter  Medication Sig Dispense Refill  . aspirin 81 MG chewable tablet Chew 81 mg by mouth daily.     . cholecalciferol (VITAMIN D3) 25 MCG (1000 UNIT) tablet Take 1,000  Units by mouth daily.    Marland Kitchen CINNAMON PO Take by mouth.    . clobetasol cream (TEMOVATE) 0.05 % Apply 1 application topically 2 (two) times daily. (Patient not taking: Reported on 04/14/2020) 30 g 1  . diclofenac (VOLTAREN) 75 MG EC tablet Take 1 tablet by mouth twice daily 60 tablet 0  . gabapentin (NEURONTIN) 300 MG capsule TAKE 1 CAPSULE BY MOUTH THREE TIMES DAILY 90 capsule 0  . ibuprofen (ADVIL) 200 MG tablet Take 200 mg by mouth every 6 (six) hours as needed.    . Insulin Glargine (LANTUS SOLOSTAR) 100 UNIT/ML Solostar Pen Inject 25 Units into the skin at bedtime. (Patient taking differently: Inject 28 Units into the skin at bedtime. ) 5 pen 11  . Insulin Pen Needle (BD PEN NEEDLE NANO U/F) 32G X 4 MM MISC Use as directed to inject insulin QD. 100 each 1  . lisinopril (ZESTRIL) 20 MG tablet Take 1 tablet by mouth once daily 90 tablet 1  . metFORMIN (GLUCOPHAGE) 1000 MG tablet 500mg  PO Q AM 1000mg  PO Q HS 135 tablet 3  . methocarbamol (ROBAXIN) 500 MG tablet Take 1 tablet (500 mg total) by mouth every 8 (eight) hours as needed for muscle spasms. 45 tablet 1  . montelukast (SINGULAIR) 10 MG tablet Take 1 tablet (10 mg total) by mouth at bedtime. PRN-  allergies 30 tablet 3  . omeprazole (PRILOSEC) 40 MG capsule Take 1 capsule by mouth once daily 90 capsule 3  . simvastatin (ZOCOR) 40 MG tablet TAKE 1 TABLET BY MOUTH ONCE DAILY AT  6PM. 90 tablet 0  . TRADJENTA 5 MG TABS tablet Take 1 tablet by mouth once daily 90 tablet 3  . traMADol (ULTRAM) 50 MG tablet TAKE 1 TABLET BY MOUTH AT BEDTIME AS NEEDED 30 tablet 1   Social History   Socioeconomic History  . Marital status: Married    Spouse name: Rosanne Ashing  . Number of children: 1  . Years of education: 9  . Highest education level: Not on file  Occupational History    Comment: machine operator  Tobacco Use  . Smoking status: Current Every Day Smoker    Packs/day: 0.75    Years: 25.00    Pack years: 18.75    Types: Cigarettes    Start date:  10/13/1984  . Smokeless tobacco: Never Used  Vaping Use  . Vaping Use: Never used  Substance and Sexual Activity  . Alcohol use: Never  . Drug use: Never  . Sexual activity: Not Currently  Other Topics Concern  . Not on file  Social History Narrative   Lives with husband   Caffeine- coffee, 1 cup daily, soda 3 cans daily   Social Determinants of Health   Financial Resource Strain:   . Difficulty of Paying Living Expenses: Not on file  Food Insecurity:   . Worried About Programme researcher, broadcasting/film/video in the Last Year: Not on file  . Ran Out of Food in the Last Year: Not on file  Transportation Needs:   . Lack of Transportation (Medical): Not on file  . Lack of Transportation (Non-Medical): Not on file  Physical Activity:   . Days of Exercise per Week: Not on file  . Minutes of Exercise per Session: Not on file  Stress:   . Feeling of Stress : Not on file  Social Connections:   . Frequency of Communication with Friends and Family: Not on file  . Frequency of Social Gatherings with Friends and Family: Not on file  . Attends Religious Services: Not on file  . Active Member of Clubs or Organizations: Not on file  . Attends Banker Meetings: Not on file  . Marital Status: Not on file  Intimate Partner Violence:   . Fear of Current or Ex-Partner: Not on file  . Emotionally Abused: Not on file  . Physically Abused: Not on file  . Sexually Abused: Not on file   Family History  Problem Relation Age of Onset  . Diabetes Mother   . Diabetes Father   . Cancer Father        Abdominal stromal tumor   . Heart disease Father   . Heart disease Sister        HEART ATTACK  . Diabetes Maternal Grandmother   . Cancer Paternal Grandfather        Prostate/Colon Cancer     OBJECTIVE:  Vitals:   07/28/20 1800 07/28/20 1802  BP: (!) 151/88   Pulse: 84   Resp: 19   Temp: 98.4 F (36.9 C)   TempSrc: Oral   SpO2: 96%   Weight:  185 lb 3 oz (84 kg)  Height:  5\' 5"  (1.651 m)      General appearance: alert; no distress CV: radial pulse 2+ Skin: two puncture wounds to RT lateral wrist, NTTP, mild erythema, no  drainage or bleeding Psychological: alert and cooperative; normal mood and affect  ASSESSMENT & PLAN:  1. Cat bite of right wrist, initial encounter     Meds ordered this encounter  Medications  . amoxicillin-clavulanate (AUGMENTIN) 875-125 MG tablet    Sig: Take 1 tablet by mouth every 12 (twelve) hours for 10 days.    Dispense:  20 tablet    Refill:  0    Order Specific Question:   Supervising Provider    Answer:   Eustace Moore [[id number]]   Declines x-ray for retained tooth  Tetanus updated Wash with warm water and mild soap Change dressing daily Augmentin prescribed.  Take as directed and to completion Follow up with PCP if symptoms persists Return or go to the ED if you have any new or worsening symptoms such as increasing redness, swelling, drainage, fever, chills, nausea, chest pain, SOB, etc...   Reviewed expectations re: course of current medical issues. Questions answered. Outlined signs and symptoms indicating need for more acute intervention. Patient verbalized understanding. After Visit Summary given.   Rennis Harding, PA-C 07/28/20 1815

## 2020-08-15 ENCOUNTER — Other Ambulatory Visit: Payer: Self-pay

## 2020-08-15 ENCOUNTER — Ambulatory Visit: Payer: 59 | Admitting: Family Medicine

## 2020-08-15 ENCOUNTER — Encounter: Payer: Self-pay | Admitting: Family Medicine

## 2020-08-15 VITALS — BP 130/68 | HR 76 | Temp 97.9°F | Resp 14 | Ht 65.5 in | Wt 191.0 lb

## 2020-08-15 DIAGNOSIS — G4733 Obstructive sleep apnea (adult) (pediatric): Secondary | ICD-10-CM | POA: Diagnosis not present

## 2020-08-15 DIAGNOSIS — Z794 Long term (current) use of insulin: Secondary | ICD-10-CM

## 2020-08-15 DIAGNOSIS — E785 Hyperlipidemia, unspecified: Secondary | ICD-10-CM

## 2020-08-15 DIAGNOSIS — Z23 Encounter for immunization: Secondary | ICD-10-CM | POA: Diagnosis not present

## 2020-08-15 DIAGNOSIS — E1169 Type 2 diabetes mellitus with other specified complication: Secondary | ICD-10-CM

## 2020-08-15 DIAGNOSIS — E119 Type 2 diabetes mellitus without complications: Secondary | ICD-10-CM

## 2020-08-15 DIAGNOSIS — E669 Obesity, unspecified: Secondary | ICD-10-CM | POA: Diagnosis not present

## 2020-08-15 DIAGNOSIS — I10 Essential (primary) hypertension: Secondary | ICD-10-CM

## 2020-08-15 MED ORDER — TRAMADOL HCL 50 MG PO TABS: 50.0000 mg | ORAL_TABLET | Freq: Every evening | ORAL | 0 refills | Status: DC | PRN

## 2020-08-15 NOTE — Patient Instructions (Signed)
F/U 4 months  

## 2020-08-15 NOTE — Assessment & Plan Note (Signed)
Continues on CPAP 

## 2020-08-15 NOTE — Progress Notes (Signed)
   Subjective:    Patient ID: Heather Sloan, female    DOB: Mar 09, 1966, 54 y.o.   MRN: [id number]  Patient presents for Follow-up (is not fasting)   Pt here to f/u chronic medical problems  Meds reviewed No new problems   HTN- taking bp meds, when she checks bp is good, no chest pain, no SOB   DM- 7.9% last A1C June    Lantus  30 units, Tradjenta MTF 1000mg  BID  she did not bring meter, no hypoglycemia symptoms    Hyperlipidemia- taking zoor    She uses ultram prn for her joint pain  Review Of Systems:  GEN- denies fatigue, fever, weight loss,weakness, recent illness HEENT- denies eye drainage, change in vision, nasal discharge, CVS- denies chest pain, palpitations RESP- denies SOB, cough, wheeze ABD- denies N/V, change in stools, abd pain GU- denies dysuria, hematuria, dribbling, incontinence MSK- + joint pain, muscle aches, injury Neuro- denies headache, dizziness, syncope, seizure activity       Objective:    BP 130/68   Pulse 76   Temp 97.9 F (36.6 C) (Temporal)   Resp 14   Ht 5' 5.5" (1.664 m)   Wt 191 lb (86.6 kg)   LMP 09/02/2011 (Approximate)   SpO2 94%   BMI 31.30 kg/m  GEN- NAD, alert and oriented x3 HEENT- PERRL, EOMI, non injected sclera, pink conjunctiva, MMM, oropharynx clear Neck- Supple, no thyromegaly CVS- RRR, no murmur RESP-CTAB ABD-NABS,soft,NT,ND EXT- No edema Pulses- Radial, DP- 2+        Assessment & Plan:      Problem List Items Addressed This Visit      Unprioritized   Hyperlipidemia associated with type 2 diabetes mellitus (HCC)    Continue zocor       Relevant Orders   Lipid panel (Completed)   Hypertension    Controlled no changes to meds      Obesity (BMI 30-39.9)   OSA (obstructive sleep apnea)    Continues on CPAP       Type 2 diabetes mellitus with other specified complication (HCC) - Primary    Goal a1c less than 7%, no change to insulin therapy or oral meds No hypoglycemia symptoms       Other Visit  Diagnoses    Need for immunization against influenza       Relevant Orders   Flu Vaccine QUAD 36+ mos IM (Completed)      Note: This dictation was prepared with Dragon dictation along with smaller phrase technology. Any transcriptional errors that result from this process are unintentional.

## 2020-08-16 ENCOUNTER — Other Ambulatory Visit: Payer: Self-pay | Admitting: Family Medicine

## 2020-08-16 LAB — CBC WITH DIFFERENTIAL/PLATELET
Absolute Monocytes: 1126 cells/uL — ABNORMAL HIGH (ref 200–950)
Basophils Absolute: 102 cells/uL (ref 0–200)
Basophils Relative: 0.8 %
Eosinophils Absolute: 474 cells/uL (ref 15–500)
Eosinophils Relative: 3.7 %
HCT: 39.3 % (ref 35.0–45.0)
Hemoglobin: 13 g/dL (ref 11.7–15.5)
Lymphs Abs: 4493 cells/uL — ABNORMAL HIGH (ref 850–3900)
MCH: 30.6 pg (ref 27.0–33.0)
MCHC: 33.1 g/dL (ref 32.0–36.0)
MCV: 92.5 fL (ref 80.0–100.0)
MPV: 9.3 fL (ref 7.5–12.5)
Monocytes Relative: 8.8 %
Neutro Abs: 6605 cells/uL (ref 1500–7800)
Neutrophils Relative %: 51.6 %
Platelets: 344 10*3/uL (ref 140–400)
RBC: 4.25 10*6/uL (ref 3.80–5.10)
RDW: 12.1 % (ref 11.0–15.0)
Total Lymphocyte: 35.1 %
WBC: 12.8 10*3/uL — ABNORMAL HIGH (ref 3.8–10.8)

## 2020-08-16 LAB — COMPREHENSIVE METABOLIC PANEL
AG Ratio: 1.8 (calc) (ref 1.0–2.5)
ALT: 27 U/L (ref 6–29)
AST: 17 U/L (ref 10–35)
Albumin: 4.3 g/dL (ref 3.6–5.1)
Alkaline phosphatase (APISO): 60 U/L (ref 37–153)
BUN/Creatinine Ratio: 21 (calc) (ref 6–22)
BUN: 23 mg/dL (ref 7–25)
CO2: 26 mmol/L (ref 20–32)
Calcium: 9.4 mg/dL (ref 8.6–10.4)
Chloride: 106 mmol/L (ref 98–110)
Creat: 1.12 mg/dL — ABNORMAL HIGH (ref 0.50–1.05)
Globulin: 2.4 g/dL (calc) (ref 1.9–3.7)
Glucose, Bld: 116 mg/dL — ABNORMAL HIGH (ref 65–99)
Potassium: 4.6 mmol/L (ref 3.5–5.3)
Sodium: 138 mmol/L (ref 135–146)
Total Bilirubin: 0.4 mg/dL (ref 0.2–1.2)
Total Protein: 6.7 g/dL (ref 6.1–8.1)

## 2020-08-16 LAB — LIPID PANEL
Cholesterol: 144 mg/dL (ref <200)
HDL: 39 mg/dL — ABNORMAL LOW (ref >=50)
LDL Cholesterol (Calc): 84 mg/dL (calc)
Non-HDL Cholesterol (Calc): 105 mg/dL (calc) (ref <130)
Total CHOL/HDL Ratio: 3.7 (calc) (ref <5.0)
Triglycerides: 112 mg/dL (ref <150)

## 2020-08-16 LAB — HEMOGLOBIN A1C
Hgb A1c MFr Bld: 8.8 % of total Hgb — ABNORMAL HIGH (ref <5.7)
Mean Plasma Glucose: 206 (calc)
eAG (mmol/L): 11.4 (calc)

## 2020-08-17 ENCOUNTER — Encounter: Payer: Self-pay | Admitting: Family Medicine

## 2020-08-17 NOTE — Assessment & Plan Note (Signed)
Controlled no changes to meds 

## 2020-08-17 NOTE — Assessment & Plan Note (Signed)
Goal a1c less than 7%, no change to insulin therapy or oral meds No hypoglycemia symptoms

## 2020-08-17 NOTE — Assessment & Plan Note (Signed)
Continue zocor 

## 2020-08-18 ENCOUNTER — Other Ambulatory Visit: Payer: Self-pay | Admitting: Family Medicine

## 2020-08-18 NOTE — Telephone Encounter (Signed)
Ok to refill 

## 2020-08-25 ENCOUNTER — Other Ambulatory Visit: Payer: Self-pay | Admitting: Family Medicine

## 2020-09-24 ENCOUNTER — Other Ambulatory Visit: Payer: Self-pay

## 2020-09-24 ENCOUNTER — Other Ambulatory Visit: Payer: 59

## 2020-09-24 DIAGNOSIS — N289 Disorder of kidney and ureter, unspecified: Secondary | ICD-10-CM

## 2020-09-24 DIAGNOSIS — D72829 Elevated white blood cell count, unspecified: Secondary | ICD-10-CM

## 2020-09-26 LAB — CBC WITH DIFFERENTIAL/PLATELET
Absolute Monocytes: 1071 cells/uL — ABNORMAL HIGH (ref 200–950)
Basophils Absolute: 83 cells/uL (ref 0–200)
Basophils Relative: 0.7 %
Eosinophils Absolute: 405 cells/uL (ref 15–500)
Eosinophils Relative: 3.4 %
HCT: 38.9 % (ref 35.0–45.0)
Hemoglobin: 12.8 g/dL (ref 11.7–15.5)
Lymphs Abs: 3522 cells/uL (ref 850–3900)
MCH: 30.1 pg (ref 27.0–33.0)
MCHC: 32.9 g/dL (ref 32.0–36.0)
MCV: 91.5 fL (ref 80.0–100.0)
MPV: 9.5 fL (ref 7.5–12.5)
Monocytes Relative: 9 %
Neutro Abs: 6819 cells/uL (ref 1500–7800)
Neutrophils Relative %: 57.3 %
Platelets: 334 10*3/uL (ref 140–400)
RBC: 4.25 10*6/uL (ref 3.80–5.10)
RDW: 11.7 % (ref 11.0–15.0)
Total Lymphocyte: 29.6 %
WBC: 11.9 10*3/uL — ABNORMAL HIGH (ref 3.8–10.8)

## 2020-09-26 LAB — BASIC METABOLIC PANEL WITH GFR
BUN/Creatinine Ratio: 15 (calc) (ref 6–22)
BUN: 17 mg/dL (ref 7–25)
CO2: 24 mmol/L (ref 20–32)
Calcium: 9.5 mg/dL (ref 8.6–10.4)
Chloride: 107 mmol/L (ref 98–110)
Creat: 1.15 mg/dL — ABNORMAL HIGH (ref 0.50–1.05)
GFR, Est African American: 62 mL/min/{1.73_m2} (ref >=60)
GFR, Est Non African American: 54 mL/min/{1.73_m2} — ABNORMAL LOW (ref >=60)
Glucose, Bld: 166 mg/dL — ABNORMAL HIGH (ref 65–99)
Potassium: 4.7 mmol/L (ref 3.5–5.3)
Sodium: 139 mmol/L (ref 135–146)

## 2020-09-26 LAB — PATHOLOGIST SMEAR REVIEW

## 2020-09-30 ENCOUNTER — Other Ambulatory Visit: Payer: Self-pay | Admitting: Family Medicine

## 2020-10-14 ENCOUNTER — Other Ambulatory Visit: Payer: Self-pay | Admitting: Family Medicine

## 2020-10-30 ENCOUNTER — Other Ambulatory Visit: Payer: Self-pay | Admitting: Family Medicine

## 2020-11-02 ENCOUNTER — Other Ambulatory Visit: Payer: Self-pay | Admitting: Family Medicine

## 2020-11-30 ENCOUNTER — Other Ambulatory Visit: Payer: Self-pay | Admitting: Family Medicine

## 2020-12-25 ENCOUNTER — Other Ambulatory Visit: Payer: Self-pay | Admitting: Family Medicine

## 2020-12-29 ENCOUNTER — Other Ambulatory Visit: Payer: Self-pay | Admitting: Family Medicine

## 2021-01-10 ENCOUNTER — Other Ambulatory Visit: Payer: Self-pay | Admitting: Family Medicine

## 2021-02-11 ENCOUNTER — Other Ambulatory Visit: Payer: Self-pay | Admitting: Family Medicine

## 2021-02-12 ENCOUNTER — Other Ambulatory Visit: Payer: Self-pay | Admitting: Family Medicine

## 2021-03-15 ENCOUNTER — Other Ambulatory Visit: Payer: Self-pay | Admitting: Family Medicine

## 2021-04-13 ENCOUNTER — Other Ambulatory Visit: Payer: Self-pay | Admitting: Family Medicine

## 2021-06-02 NOTE — Progress Notes (Signed)
Office Visit Note  Patient: Heather Sloan             Date of Birth: [birth year]           MRN: [id number]             PCP: Shelby Dubin, FNP Referring: Shelby Dubin, FNP Visit Date: 06/16/2021 Occupation: @  Subjective:  Pain in multiple joints.   History of Present Illness: Heather Sloan is a 55 y.o. female seen in consultation per request of her PCP.  She is a right-handed female.  According the patient for the last 5 years she has been having increased pain and discomfort in her joints.  She has episodes of increased pain which she calls flares.  She moved to [place] from [place] in 2018.  She describes pain and discomfort in her neck, lower back, shoulders, elbows due to tennis elbow, bilateral wrist, bilateral hands, right hip, bilateral knee joints, right ankle and right foot.  She states she had right foot some tendon repair surgery since then she has had right ankle and right foot pain.  She also has been diagnosed with degenerative disease of lumbar spine which causes chronic discomfort.  She recalls an episode in 2017 when she was hospitalized due to throat pain.  She states she was told that she had "acute arthritis of her throat".  She states she has flares when she will have increased pain but no not increased swelling.  She denies any history of oral ulcers, nasal ulcers, malar rash, Raynaud's phenomenon, photosensitivity, lymphadenopathy.  No one in her family has autoimmune disease.  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  She has noticed over time she has developed deformities in her hands and is unable to extend her fingers completely.  She is gravida 2, para 1, miscarriages 1.  Activities of Daily Living:  Patient reports morning stiffness for 30-60 minutes.   Patient Reports nocturnal pain.  Difficulty dressing/grooming: Reports Difficulty climbing stairs: Reports Difficulty getting out of chair: Reports Difficulty using  hands for taps, buttons, cutlery, and/or writing: Reports  Review of Systems  Constitutional:  Positive for fatigue.  HENT:  Negative for mouth sores, mouth dryness and nose dryness.   Eyes:  Negative for pain, itching and dryness.  Respiratory:  Negative for shortness of breath and difficulty breathing.   Cardiovascular:  Negative for chest pain and palpitations.  Gastrointestinal:  Negative for blood in stool, constipation and diarrhea.  Endocrine: Negative for increased urination.  Genitourinary:  Negative for difficulty urinating.  Musculoskeletal:  Positive for joint pain, joint pain, myalgias, morning stiffness, muscle tenderness and myalgias. Negative for joint swelling.  Skin:  Negative for color change, rash, redness and sensitivity to sunlight.  Allergic/Immunologic: Positive for susceptible to infections.  Neurological:  Positive for numbness, headaches and weakness. Negative for dizziness and memory loss.  Hematological:  Positive for bruising/bleeding tendency. Negative for swollen glands.  Psychiatric/Behavioral:  Positive for sleep disturbance. Negative for depressed mood and confusion. The patient is nervous/anxious.    PMFS History:  Patient Active Problem List   Diagnosis Date Noted   DDD (degenerative disc disease), lumbar 05/14/2019   Carotid artery disease (HCC) 05/14/2019   Chest pain 08/04/2018   Type 2 diabetes mellitus with other specified complication (HCC) 10/13/2017   Hypertension 10/13/2017   Hyperlipidemia associated with type 2 diabetes mellitus (HCC) 10/13/2017   OSA (obstructive sleep apnea) 10/13/2017  Chronic foot pain, right 10/13/2017   Chronic hip pain 10/13/2017   Major depression 10/13/2017   Obesity (BMI 30-39.9) 10/13/2017   Ventricular premature depolarization 12/04/2014   Posterior tibial tendon dysfunction (PTTD) of right lower extremity 04/23/2013   Gastroesophageal reflux disease 01/07/2011   Cervical dysplasia 01/07/2011    Past  Medical History:  Diagnosis Date   Arthritis    "qwhere" (08/04/2018)   Depression    hx   Fatty liver disease, nonalcoholic    [place] 2013 [place], Hepatitis Labs negative   Frequent sinus infections    GERD (gastroesophageal reflux disease)    EGD March 2018, neg H pylori   Hyperlipidemia    Hypertension    Migraine    "3-4/year now" (08/04/2018)   OSA on CPAP    Pneumonia 2006   "right after I had pneumonia shot"   PVC (premature ventricular contraction)    Holter Monitor  2015   Type II diabetes mellitus (HCC)     Family History  Problem Relation Age of Onset   Diabetes Mother    Diabetes Father    Cancer Father        Abdominal stromal tumor    Heart disease Father    Heart disease Sister        HEART ATTACK   Diabetes Maternal Grandmother    Cancer Paternal Grandfather        Prostate/Colon Cancer   Healthy Son    Past Surgical History:  Procedure Laterality Date   FOOT TENDON SURGERY Right 03/2017   "took tendon out; moved tendon behind that tendon to put in it's place; cut heel off, moved heel over, put 3 inch screw in to hold it in place"   LAPAROSCOPIC CHOLECYSTECTOMY  1990s   THROAT SURGERY  2017   "thought I had an abcess; wasn't that; had acute peri-arthritis"   Social History   Social History Narrative   Lives with husband   Caffeine- coffee, 1 cup daily, soda 3 cans daily   Immunization History  Administered Date(s) Administered   H1N1 09/24/2008   Influenza Split 08/26/2006, 11/03/2011, 09/09/2015, 09/07/2016   Influenza Whole 08/01/2013   Influenza,inj,Quad PF,6+ Mos 10/13/2017, 07/25/2019, 08/15/2020   Pneumococcal Polysaccharide-23 10/26/2005   Td 06/28/2003   Tdap 06/04/2013, 07/28/2020     Objective: Vital Signs: BP 134/80 (BP Location: Right Arm, Patient Position: Sitting, Cuff Size: Normal)   Pulse 81   Ht 5\' 5"  (1.651 m)   Wt 186 lb (84.4 kg)   LMP 09/02/2011 (Approximate)   BMI 30.95 kg/m    Physical Exam Vitals and nursing  note reviewed.  Constitutional:      Appearance: She is well-developed.  HENT:     Head: Normocephalic and atraumatic.  Eyes:     Conjunctiva/sclera: Conjunctivae normal.  Cardiovascular:     Rate and Rhythm: Normal rate and regular rhythm.     Heart sounds: Normal heart sounds.  Pulmonary:     Effort: Pulmonary effort is normal.     Breath sounds: Normal breath sounds.  Abdominal:     General: Bowel sounds are normal.     Palpations: Abdomen is soft.  Musculoskeletal:     Cervical back: Normal range of motion.  Lymphadenopathy:     Cervical: No cervical adenopathy.  Skin:    General: Skin is warm and dry.     Capillary Refill: Capillary refill takes less than 2 seconds.  Neurological:     Mental Status: She is alert and  oriented to person, place, and time.  Psychiatric:        Behavior: Behavior normal.     Musculoskeletal Exam: C-spine was in good range of motion with minimal discomfort.  She had discomfort range of motion of her lumbar spine.  Shoulder joints, elbow joints, wrist joints with good range of motion.  She had bilateral CMC PIP and DIP thickening.  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  Hip joints and knee joints in good range of motion.  No warmth swelling or effusion was noted.  She had some postsurgical changes in her right ankle joint.  She had bilateral pes planus and dorsal spurring.  CDAI Exam: CDAI Score: -- Patient Global: --; Provider Global: -- Swollen: --; Tender: -- Joint Exam 06/16/2021   No joint exam has been documented for this visit   There is currently no information documented on the homunculus. Go to the Rheumatology activity and complete the homunculus joint exam.  Investigation: No additional findings.  Imaging: XR Cervical Spine 2 or 3 views  Result Date: 06/16/2021 Anterior osteophytes at C5, C6 and C7 were noted.  Facet joint arthropathy was noted.  No significant disc space narrowing was noted.  Impression: These findings are consistent with mild spondylosis and facet joint arthropathy.  XR Foot 2 Views Left  Result Date: 06/16/2021 First MTP, PIP and DIP narrowing was noted.  Dorsal spurring was noted.  No tibiotalar or subtalar joint space narrowing was noted.  Inferior and posterior calcaneal spurs were noted.  No erosive changes were noted. Impression: These findings are consistent with osteoarthritis of the foot.  XR Foot 2 Views Right  Result Date: 06/16/2021 First MTP, PIP and DIP narrowing was noted.  Dorsal spurring was noted.  Screw was noted in the calcaneum.  Inferior calcaneal spur was noted.  No tibiotalar or subtalar joint space narrowing was noted. Impression: These findings are consistent with osteoarthritis and postsurgical changes.  XR Hand 2 View Left  Result Date: 06/16/2021 CMC, PIP and DIP narrowing was noted.  No MCP, intercarpal or radiocarpal joint space narrowing was noted.  No erosive changes were noted. Impression: These findings are consistent with osteoarthritis of the hand.  XR Hand 2 View Right  Result Date: 06/16/2021 CMC, PIP and DIP narrowing was noted.  No MCP, intercarpal or radiocarpal joint space narrowing was noted.  No erosive changes were noted. Impression: These findings are consistent with osteoarthritis of the hand.  XR KNEE 3 VIEW LEFT  Result Date: 06/16/2021 No medial or lateral compartment narrowing was noted.  Lateral osteophytes were noted.  Moderate patellofemoral narrowing was noted.  No chondrocalcinosis was noted. 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  XR KNEE 3 VIEW RIGHT  Result Date: 06/16/2021 No medial or lateral compartment narrowing was noted.  Mild patellofemoral narrowing was noted.  No chondrocalcinosis was noted. 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   Recent Labs: Lab Results  Component Value Date   WBC 11.9 (H) 09/24/2020   HGB  12.8 09/24/2020   PLT 334 09/24/2020   NA 139 09/24/2020   K 4.7 09/24/2020   CL 107 09/24/2020   CO2 24 09/24/2020   GLUCOSE 166 (H) 09/24/2020   BUN 17 09/24/2020   CREATININE 1.15 (H) 09/24/2020   BILITOT 0.4 08/15/2020   ALKPHOS 54 08/09/2018   AST 17 08/15/2020   ALT 27 08/15/2020   PROT 6.7 08/15/2020   ALBUMIN 3.7 08/09/2018  CALCIUM 9.5 09/24/2020   GFRAA 62 09/24/2020    Speciality Comments: No specialty comments available.  Procedures:  No procedures performed Allergies: Codeine, Bactrim [sulfamethoxazole-trimethoprim], and Wheat extract   Assessment / Plan:     Visit Diagnoses: Polyarthritis-she continues to have pain and discomfort in multiple joints over the last 5 years.  She states [place] due to increased joint pain in the colder weather.  Pain in both hands -she complains of pain and discomfort in her bilateral hands.  No warmth swelling or effusion was noted.  She had bilateral PIP and DIP prominence consistent with osteoarthritis.  She also had some dorsal fibromatosis over PIP joints.  No synovitis was noted.  A handout on hand exercises was given.  Plan: XR Hand 2 View Right, XR Hand 2 View Left, x-ray of the bilateral hands were consistent with osteoarthritis.  Sedimentation rate, Rheumatoid factor, Cyclic citrul peptide antibody, IgG, ANA  Lateral epicondylitis of both elbows-she gives history of bilateral tennis elbow.  No tenderness was noted over the lateral epicondyle region.  Bilateral carpal tunnel syndrome-she gives history of bilateral carpal tunnel syndrome related to her work.  She does not recall having work-up for it.  Chronic pain of both knees -she complains of pain and discomfort in her bilateral knee joints.  No warmth swelling or effusion was noted.  Both knee joints with good range of motion.  A handout on lower extremity muscle strengthening exercises was given.  Plan: XR KNEE 3 VIEW RIGHT,  XR KNEE 3 VIEW LEFT.  X-rays were consistent with chondromalacia patella.  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  She states she has a screw in her right heel.  Pain in both feet -she complains of pain and discomfort in bilateral feet.  Pes planus and dorsal spurring was noted.  No synovitis was noted.  Plan: XR Foot 2 Views Right, XR Foot 2 Views Left.  X-ray showed osteoarthritic changes and postsurgical changes in the right foot.  Neck pain -she has been experiencing neck pain.  She had good range of motion of her cervical spine.  Plan: XR Cervical Spine 2 or 3 views.  X-ray showed mild spondylosis and facet joint arthropathy.  A handout on neck exercises was given.  DDD (degenerative disc disease), lumbar-she was diagnosed with degenerative disc disease in [place].  She continues to have lower back pain and discomfort.  A handout on back exercises was given.  Other fatigue -she has been experiencing increased fatigue.  She states she also had episodic increased WBC count with episodes of arthritis and fatigue.  Plan: CBC with Differential/Platelet, COMPLETE METABOLIC PANEL WITH GFR, CK, TSH, Serum protein electrophoresis with reflex  Other medical problems are listed as follows:  Essential hypertension-blood pressure is normal today.  Ventricular premature depolarization  History of hyperlipidemia  Occlusion of left carotid artery  Type 2 diabetes mellitus with other specified complication, with long-term current use of insulin (HCC)  History of gastroesophageal reflux (GERD)  History of depression  OSA (obstructive sleep apnea)  Orders: Orders Placed This Encounter  Procedures   XR Hand 2 View Right   XR Hand 2 View Left   XR KNEE 3 VIEW RIGHT   XR KNEE 3 VIEW LEFT   XR Foot 2 Views Right   XR Foot 2 Views Left   XR Cervical Spine 2 or 3 views  CBC with Differential/Platelet   COMPLETE  METABOLIC PANEL WITH GFR   Sedimentation rate   Rheumatoid factor   Cyclic citrul peptide antibody, IgG   CK   TSH   ANA   Serum protein electrophoresis with reflex    No orders of the defined types were placed in this encounter.   Follow-Up Instructions: Return for Pain in multiple joints.   Pollyann Savoy, MD  Note - This record has been created using Animal nutritionist.  Chart creation errors have been sought, but may not always  have been located. Such creation errors do not reflect on  the standard of medical care.

## 2021-06-16 ENCOUNTER — Ambulatory Visit: Payer: Self-pay

## 2021-06-16 ENCOUNTER — Other Ambulatory Visit: Payer: Self-pay

## 2021-06-16 ENCOUNTER — Encounter: Payer: Self-pay | Admitting: Rheumatology

## 2021-06-16 ENCOUNTER — Ambulatory Visit: Payer: 59 | Admitting: Rheumatology

## 2021-06-16 VITALS — BP 134/80 | HR 81 | Ht 65.0 in | Wt 186.0 lb

## 2021-06-16 DIAGNOSIS — M25562 Pain in left knee: Secondary | ICD-10-CM | POA: Diagnosis not present

## 2021-06-16 DIAGNOSIS — M79672 Pain in left foot: Secondary | ICD-10-CM | POA: Diagnosis not present

## 2021-06-16 DIAGNOSIS — M25561 Pain in right knee: Secondary | ICD-10-CM

## 2021-06-16 DIAGNOSIS — M79642 Pain in left hand: Secondary | ICD-10-CM | POA: Diagnosis not present

## 2021-06-16 DIAGNOSIS — Z794 Long term (current) use of insulin: Secondary | ICD-10-CM

## 2021-06-16 DIAGNOSIS — M7711 Lateral epicondylitis, right elbow: Secondary | ICD-10-CM | POA: Diagnosis not present

## 2021-06-16 DIAGNOSIS — M7712 Lateral epicondylitis, left elbow: Secondary | ICD-10-CM

## 2021-06-16 DIAGNOSIS — E1169 Type 2 diabetes mellitus with other specified complication: Secondary | ICD-10-CM

## 2021-06-16 DIAGNOSIS — M21941 Unspecified acquired deformity of hand, right hand: Secondary | ICD-10-CM

## 2021-06-16 DIAGNOSIS — M79671 Pain in right foot: Secondary | ICD-10-CM | POA: Diagnosis not present

## 2021-06-16 DIAGNOSIS — M542 Cervicalgia: Secondary | ICD-10-CM

## 2021-06-16 DIAGNOSIS — M5136 Other intervertebral disc degeneration, lumbar region: Secondary | ICD-10-CM

## 2021-06-16 DIAGNOSIS — G8929 Other chronic pain: Secondary | ICD-10-CM

## 2021-06-16 DIAGNOSIS — Z8659 Personal history of other mental and behavioral disorders: Secondary | ICD-10-CM

## 2021-06-16 DIAGNOSIS — Z8719 Personal history of other diseases of the digestive system: Secondary | ICD-10-CM

## 2021-06-16 DIAGNOSIS — M130 Polyarthritis, unspecified: Secondary | ICD-10-CM | POA: Diagnosis not present

## 2021-06-16 DIAGNOSIS — M76821 Posterior tibial tendinitis, right leg: Secondary | ICD-10-CM

## 2021-06-16 DIAGNOSIS — G4733 Obstructive sleep apnea (adult) (pediatric): Secondary | ICD-10-CM

## 2021-06-16 DIAGNOSIS — G5603 Carpal tunnel syndrome, bilateral upper limbs: Secondary | ICD-10-CM | POA: Diagnosis not present

## 2021-06-16 DIAGNOSIS — R5383 Other fatigue: Secondary | ICD-10-CM

## 2021-06-16 DIAGNOSIS — Z8639 Personal history of other endocrine, nutritional and metabolic disease: Secondary | ICD-10-CM

## 2021-06-16 DIAGNOSIS — M79641 Pain in right hand: Secondary | ICD-10-CM | POA: Diagnosis not present

## 2021-06-16 DIAGNOSIS — I10 Essential (primary) hypertension: Secondary | ICD-10-CM

## 2021-06-16 DIAGNOSIS — I493 Ventricular premature depolarization: Secondary | ICD-10-CM

## 2021-06-16 DIAGNOSIS — I6522 Occlusion and stenosis of left carotid artery: Secondary | ICD-10-CM

## 2021-06-16 NOTE — Patient Instructions (Signed)
Back Exercises The following exercises strengthen the muscles that help to support the trunk and back. They also help to keep the lower back flexible. Doing these exercises can help to prevent back pain or lessen existing pain. If you have back pain or discomfort, try doing these exercises 2-3 times each day or as told by your health care provider. As your pain improves, do them once each day, but increase the number of times that you repeat the steps for each exercise (do more repetitions). To prevent the recurrence of back pain, continue to do these exercises once each day or as told by your health care provider. Do exercises exactly as told by your health care provider and adjust them as directed. It is normal to feel mild stretching, pulling, tightness, or discomfort as you do these exercises, but you should stop right away if youfeel sudden pain or your pain gets worse. Exercises Single knee to chest Repeat these steps 3-5 times for each leg: Lie on your back on a firm bed or the floor with your legs extended. Bring one knee to your chest. Your other leg should stay extended and in contact with the floor. Hold your knee in place by grabbing your knee or thigh with both hands and hold. Pull on your knee until you feel a gentle stretch in your lower back or buttocks. Hold the stretch for 10-30 seconds. Slowly release and straighten your leg. Pelvic tilt Repeat these steps 5-10 times: Lie on your back on a firm bed or the floor with your legs extended. Bend your knees so they are pointing toward the ceiling and your feet are flat on the floor. Tighten your lower abdominal muscles to press your lower back against the floor. This motion will tilt your pelvis so your tailbone points up toward the ceiling instead of pointing to your feet or the floor. With gentle tension and even breathing, hold this position for 5-10 seconds. Cat-cow Repeat these steps until your lower back becomes more  flexible: Get into a hands-and-knees position on a firm surface. Keep your hands under your shoulders, and keep your knees under your hips. You may place padding under your knees for comfort. Let your head hang down toward your chest. Contract your abdominal muscles and point your tailbone toward the floor so your lower back becomes rounded like the back of a cat. Hold this position for 5 seconds. Slowly lift your head, let your abdominal muscles relax and point your tailbone up toward the ceiling so your back forms a sagging arch like the back of a cow. Hold this position for 5 seconds.  Press-ups Repeat these steps 5-10 times: Lie on your abdomen (face-down) on the floor. Place your palms near your head, about shoulder-width apart. Keeping your back as relaxed as possible and keeping your hips on the floor, slowly straighten your arms to raise the top half of your body and lift your shoulders. Do not use your back muscles to raise your upper torso. You may adjust the placement of your hands to make yourself more comfortable. Hold this position for 5 seconds while you keep your back relaxed. Slowly return to lying flat on the floor.  Bridges Repeat these steps 10 times: Lie on your back on a firm surface. Bend your knees so they are pointing toward the ceiling and your feet are flat on the floor. Your arms should be flat at your sides, next to your body. Tighten your buttocks muscles and lift your   buttocks off the floor until your waist is at almost the same height as your knees. You should feel the muscles working in your buttocks and the back of your thighs. If you do not feel these muscles, slide your feet 1-2 inches farther away from your buttocks. Hold this position for 3-5 seconds. Slowly lower your hips to the starting position, and allow your buttocks muscles to relax completely. If this exercise is too easy, try doing it with your arms crossed over yourchest. Abdominal  crunches Repeat these steps 5-10 times: Lie on your back on a firm bed or the floor with your legs extended. Bend your knees so they are pointing toward the ceiling and your feet are flat on the floor. Cross your arms over your chest. Tip your chin slightly toward your chest without bending your neck. Tighten your abdominal muscles and slowly raise your trunk (torso) high enough to lift your shoulder blades a tiny bit off the floor. Avoid raising your torso higher than that because it can put too much stress on your low back and does not help to strengthen your abdominal muscles. Slowly return to your starting position. Back lifts Repeat these steps 5-10 times: Lie on your abdomen (face-down) with your arms at your sides, and rest your forehead on the floor. Tighten the muscles in your legs and your buttocks. Slowly lift your chest off the floor while you keep your hips pressed to the floor. Keep the back of your head in line with the curve in your back. Your eyes should be looking at the floor. Hold this position for 3-5 seconds. Slowly return to your starting position. Contact a health care provider if: Your back pain or discomfort gets much worse when you do an exercise. Your worsening back pain or discomfort does not lessen within 2 hours after you exercise. If you have any of these problems, stop doing these exercises right away. Do not do them again unless your health care provider says that you can. Get help right away if: You develop sudden, severe back pain. If this happens, stop doing the exercises right away. Do not do them again unless your health care provider says that you can. This information is not intended to replace advice given to you by your health care provider. Make sure you discuss any questions you have with your healthcare provider. Document Revised: 02/22/2019 Document Reviewed: 07/20/2018 Elsevier Patient Education  2022 [place] [employer], 15(4), [phone]. Retrieved August 07, 2018 from [email or web address]">  Knee Exercises Ask your health care provider which exercises are safe for you. Do exercises exactly as told by your health care provider and adjust them as directed. It is normal to feel mild stretching, pulling, tightness, or discomfort as you do these exercises. Stop right away if you feel sudden pain or your pain gets worse. Do not begin these exercises until told by your health care provider. Stretching and range-of-motion exercises These exercises warm up your muscles and joints and improve the movement and flexibility of your knee. These exercises also help to relieve pain andswelling. Knee extension, prone Lie on your abdomen (prone position) on a bed. Place your left / right knee just beyond the edge of the surface so your knee is not on the bed. You can put a towel under your left / right thigh just above your kneecap for comfort. Relax your leg muscles and allow gravity to straighten your knee (extension). You should feel a stretch behind your  left / right knee. Hold this position for __________ seconds. Scoot up so your knee is supported between repetitions. Repeat __________ times. Complete this exercise __________ times a day. Knee flexion, active  Lie on your back with both legs straight. If this causes back discomfort, bend your left / right knee so your foot is flat on the floor. Slowly slide your left / right heel back toward your buttocks. Stop when you feel a gentle stretch in the front of your knee or thigh (flexion). Hold this position for __________ seconds. Slowly slide your left / right heel back to the starting position. Repeat __________ times. Complete this exercise __________ times a day. Quadriceps stretch, prone  Lie on your abdomen on a firm surface, such as a bed or padded floor. Bend your left / right knee and hold your ankle. If you cannot reach your ankle or pant  leg, loop a belt around your foot and grab the belt instead. Gently pull your heel toward your buttocks. Your knee should not slide out to the side. You should feel a stretch in the front of your thigh and knee (quadriceps). Hold this position for __________ seconds. Repeat __________ times. Complete this exercise __________ times a day. Hamstring, supine Lie on your back (supine position). Loop a belt or towel over the ball of your left / right foot. The ball of your foot is on the walking surface, right under your toes. Straighten your left / right knee and slowly pull on the belt to raise your leg until you feel a gentle stretch behind your knee (hamstring). Do not let your knee bend while you do this. Keep your other leg flat on the floor. Hold this position for __________ seconds. Repeat __________ times. Complete this exercise __________ times a day. Strengthening exercises These exercises build strength and endurance in your knee. Endurance is theability to use your muscles for a long time, even after they get tired. Quadriceps, isometric This exercise stretches the muscles in front of your thigh (quadriceps) without moving your knee joint (isometric). Lie on your back with your left / right leg extended and your other knee bent. Put a rolled towel or small pillow under your knee if told by your health care provider. Slowly tense the muscles in the front of your left / right thigh. You should see your kneecap slide up toward your hip or see increased dimpling just above the knee. This motion will push the back of the knee toward the floor. For __________ seconds, hold the muscle as tight as you can without increasing your pain. Relax the muscles slowly and completely. Repeat __________ times. Complete this exercise __________ times a day. Straight leg raises This exercise stretches the muscles in front of your thigh (quadriceps) and the muscles that move your hips (hip flexors). Lie on  your back with your left / right leg extended and your other knee bent. Tense the muscles in the front of your left / right thigh. You should see your kneecap slide up or see increased dimpling just above the knee. Your thigh may even shake a bit. Keep these muscles tight as you raise your leg 4-6 inches (10-15 cm) off the floor. Do not let your knee bend. Hold this position for __________ seconds. Keep these muscles tense as you lower your leg. Relax your muscles slowly and completely after each repetition. Repeat __________ times. Complete this exercise __________ times a day. Hamstring, isometric Lie on your back on a firm surface.  Bend your left / right knee about __________ degrees. Dig your left / right heel into the surface as if you are trying to pull it toward your buttocks. Tighten the muscles in the back of your thighs (hamstring) to "dig" as hard as you can without increasing any pain. Hold this position for __________ seconds. Release the tension gradually and allow your muscles to relax completely for __________ seconds after each repetition. Repeat __________ times. Complete this exercise __________ times a day. Hamstring curls If told by your health care provider, do this exercise while wearing ankle weights. Begin with __________ lb weights. Then increase the weight by 1 lb (0.5 kg) increments. Do not wear ankle weights that are more than __________ lb. Lie on your abdomen with your legs straight. Bend your left / right knee as far as you can without feeling pain. Keep your hips flat against the floor. Hold this position for __________ seconds. Slowly lower your leg to the starting position. Repeat __________ times. Complete this exercise __________ times a day. Squats This exercise strengthens the muscles in front of your thigh and knee (quadriceps). Stand in front of a table, with your feet and knees pointing straight ahead. You may rest your hands on the table for balance  but not for support. Slowly bend your knees and lower your hips like you are going to sit in a chair. Keep your weight over your heels, not over your toes. Keep your lower legs upright so they are parallel with the table legs. Do not let your hips go lower than your knees. Do not bend lower than told by your health care provider. If your knee pain increases, do not bend as low. Hold the squat position for __________ seconds. Slowly push with your legs to return to standing. Do not use your hands to pull yourself to standing. Repeat __________ times. Complete this exercise __________ times a day. Wall slides This exercise strengthens the muscles in front of your thigh and knee (quadriceps). Lean your back against a smooth wall or door, and walk your feet out 18-24 inches (46-61 cm) from it. Place your feet hip-width apart. Slowly slide down the wall or door until your knees bend __________ degrees. Keep your knees over your heels, not over your toes. Keep your knees in line with your hips. Hold this position for __________ seconds. Repeat __________ times. Complete this exercise __________ times a day. Straight leg raises This exercise strengthens the muscles that rotate the leg at the hip and move it away from your body (hip abductors). Lie on your side with your left / right leg in the top position. Lie so your head, shoulder, knee, and hip line up. You may bend your bottom knee to help you keep your balance. Roll your hips slightly forward so your hips are stacked directly over each other and your left / right knee is facing forward. Leading with your heel, lift your top leg 4-6 inches (10-15 cm). You should feel the muscles in your outer hip lifting. Do not let your foot drift forward. Do not let your knee roll toward the ceiling. Hold this position for __________ seconds. Slowly return your leg to the starting position. Let your muscles relax completely after each repetition. Repeat  __________ times. Complete this exercise __________ times a day. Straight leg raises This exercise stretches the muscles that move your hips away from the front of the pelvis (hip extensors). Lie on your abdomen on a firm surface. You can  put a pillow under your hips if that is more comfortable. Tense the muscles in your buttocks and lift your left / right leg about 4-6 inches (10-15 cm). Keep your knee straight as you lift your leg. Hold this position for __________ seconds. Slowly lower your leg to the starting position. Let your leg relax completely after each repetition. Repeat __________ times. Complete this exercise __________ times a day. This information is not intended to replace advice given to you by your health care provider. Make sure you discuss any questions you have with your healthcare provider. Document Revised: 08/08/2018 Document Reviewed: 08/08/2018 Elsevier Patient Education  2022 [employer] Cervical Strain and Sprain Rehab Ask your health care provider which exercises are safe for you. Do exercises exactly as told by your health care provider and adjust them as directed. It is normal to feel mild stretching, pulling, tightness, or discomfort as you do these exercises. Stop right away if you feel sudden pain or your pain gets worse. Do not begin these exercises until told by your health care provider. Stretching and range-of-motion exercises Cervical side bending  Using good posture, sit on a stable chair or stand up. Without moving your shoulders, slowly tilt your left / right ear to your shoulder until you feel a stretch in the opposite side neck muscles. You should be looking straight ahead. Hold for __________ seconds. Repeat with the other side of your neck. Repeat __________ times. Complete this exercise __________ times a day. Cervical rotation  Using good posture, sit on a stable chair or stand up. Slowly turn your head to the side as if you are looking over  your left / right shoulder. Keep your eyes level with the ground. Stop when you feel a stretch along the side and the back of your neck. Hold for __________ seconds. Repeat this by turning to your other side. Repeat __________ times. Complete this exercise __________ times a day. Thoracic extension and pectoral stretch Roll a towel or a small blanket so it is about 4 inches (10 cm) in diameter. Lie down on your back on a firm surface. Put the towel lengthwise, under your spine in the middle of your back. It should not be under your shoulder blades. The towel should line up with your spine from your middle back to your lower back. Put your hands behind your head and let your elbows fall out to your sides. Hold for __________ seconds. Repeat __________ times. Complete this exercise __________ times a day. Strengthening exercises Isometric upper cervical flexion Lie on your back with a thin pillow behind your head and a small rolled-up towel under your neck. Gently tuck your chin toward your chest and nod your head down to look toward your feet. Do not lift your head off the pillow. Hold for __________ seconds. Release the tension slowly. Relax your neck muscles completely before you repeat this exercise. Repeat __________ times. Complete this exercise __________ times a day. Isometric cervical extension  Stand about 6 inches (15 cm) away from a wall, with your back facing the wall. Place a soft object, about 6-8 inches (15-20 cm) in diameter, between the back of your head and the wall. A soft object could be a small pillow, a ball, or a folded towel. Gently tilt your head back and press into the soft object. Keep your jaw and forehead relaxed. Hold for __________ seconds. Release the tension slowly. Relax your neck muscles completely before you repeat this exercise. Repeat __________ times. Complete  this exercise __________ times a day. Posture and body mechanics Body mechanics refers to  the movements and positions of your body while you do your daily activities. Posture is part of body mechanics. Good posture and healthy body mechanics can help to relieve stress in your body's tissues and joints. Good posture means that your spine is in its natural S-curve position (your spine is neutral), your shoulders are pulled back slightly, and your head is not tipped forward. The following are general guidelines for applying improved posture andbody mechanics to your everyday activities. Sitting  When sitting, keep your spine neutral and keep your feet flat on the floor. Use a footrest, if necessary, and keep your thighs parallel to the floor. Avoid rounding your shoulders, and avoid tilting your head forward. When working at a desk or a computer, keep your desk at a height where your hands are slightly lower than your elbows. Slide your chair under your desk so you are close enough to maintain good posture. When working at a computer, place your monitor at a height where you are looking straight ahead and you do not have to tilt your head forward or downward to look at the screen.  Standing  When standing, keep your spine neutral and keep your feet about hip-width apart. Keep a slight bend in your knees. Your ears, shoulders, and hips should line up. When you do a task in which you stand in one place for a long time, place one foot up on a stable object that is 2-4 inches (5-10 cm) high, such as a footstool. This helps keep your spine neutral.  Resting When lying down and resting, avoid positions that are most painful for you. Try to support your neck in a neutral position. You can use a contour pillow or asmall rolled-up towel. Your pillow should support your neck but not push on it. This information is not intended to replace advice given to you by your health care provider. Make sure you discuss any questions you have with your healthcare provider. Document Revised: 02/07/2019 Document  Reviewed: 07/19/2018 Else[employer] Patient Education  2022 [employer] Hand Exercises Hand exercises can be helpful for almost anyone. These exercises can strengthen the hands, improve flexibility and movement, and increase blood flow to the hands. These results can make work and daily tasks easier. Hand exercises can be especially helpful for people who have joint pain from arthritis or have nerve damage from overuse (carpal tunnel syndrome). These exercises can also help people who have injured a hand. Exercises Most of these hand exercises are gentle stretching and motion exercises. It is usually safe to do them often throughout the day. Warming up your hands before exercise may help to reduce stiffness. You can do this with gentle massage orby placing your hands in warm water for 10-15 minutes. It is normal to feel some stretching, pulling, tightness, or mild discomfort as you begin new exercises. This will gradually improve. Stop an exercise right away if you feel sudden, severe pain or your pain gets worse. Ask your healthcare provider which exercises are best for you. Knuckle bend or "claw" fist Stand or sit with your arm, hand, and all five fingers pointed straight up. Make sure to keep your wrist straight during the exercise. Gently bend your fingers down toward your palm until the tips of your fingers are touching the top of your palm. Keep your big knuckle straight and just bend the small knuckles in your fingers. Hold this position  for __________ seconds. Straighten (extend) your fingers back to the starting position. Repeat this exercise 5-10 times with each hand. Full finger fist Stand or sit with your arm, hand, and all five fingers pointed straight up. Make sure to keep your wrist straight during the exercise. Gently bend your fingers into your palm until the tips of your fingers are touching the middle of your palm. Hold this position for __________ seconds. Extend your fingers back  to the starting position, stretching every joint fully. Repeat this exercise 5-10 times with each hand. Straight fist Stand or sit with your arm, hand, and all five fingers pointed straight up. Make sure to keep your wrist straight during the exercise. Gently bend your fingers at the big knuckle, where your fingers meet your hand, and the middle knuckle. Keep the knuckle at the tips of your fingers straight and try to touch the bottom of your palm. Hold this position for __________ seconds. Extend your fingers back to the starting position, stretching every joint fully. Repeat this exercise 5-10 times with each hand. Tabletop Stand or sit with your arm, hand, and all five fingers pointed straight up. Make sure to keep your wrist straight during the exercise. Gently bend your fingers at the big knuckle, where your fingers meet your hand, as far down as you can while keeping the small knuckles in your fingers straight. Think of forming a tabletop with your fingers. Hold this position for __________ seconds. Extend your fingers back to the starting position, stretching every joint fully. Repeat this exercise 5-10 times with each hand. Finger spread Place your hand flat on a table with your palm facing down. Make sure your wrist stays straight as you do this exercise. Spread your fingers and thumb apart from each other as far as you can until you feel a gentle stretch. Hold this position for __________ seconds. Bring your fingers and thumb tight together again. Hold this position for __________ seconds. Repeat this exercise 5-10 times with each hand. Making circles Stand or sit with your arm, hand, and all five fingers pointed straight up. Make sure to keep your wrist straight during the exercise. Make a circle by touching the tip of your thumb to the tip of your index finger. Hold for __________ seconds. Then open your hand wide. Repeat this motion with your thumb and each finger on your  hand. Repeat this exercise 5-10 times with each hand. Thumb motion Sit with your forearm resting on a table and your wrist straight. Your thumb should be facing up toward the ceiling. Keep your fingers relaxed as you move your thumb. Lift your thumb up as high as you can toward the ceiling. Hold for __________ seconds. Bend your thumb across your palm as far as you can, reaching the tip of your thumb for the small finger (pinkie) side of your palm. Hold for __________ seconds. Repeat this exercise 5-10 times with each hand. Grip strengthening  Hold a stress ball or other soft ball in the middle of your hand. Slowly increase the pressure, squeezing the ball as much as you can without causing pain. Think of bringing the tips of your fingers into the middle of your palm. All of your finger joints should bend when doing this exercise. Hold your squeeze for __________ seconds, then relax. Repeat this exercise 5-10 times with each hand. Contact a health care provider if: Your hand pain or discomfort gets much worse when you do an exercise. Your hand pain or discomfort  does not improve within 2 hours after you exercise. If you have any of these problems, stop doing these exercises right away. Do not do them again unless your health care provider says that you can. Get help right away if: You develop sudden, severe hand pain or swelling. If this happens, stop doing these exercises right away. Do not do them again unless your health care provider says that you can. This information is not intended to replace advice given to you by your health care provider. Make sure you discuss any questions you have with your healthcare provider. Document Revised: 02/08/2019 Document Reviewed: 10/19/2018 Elsevier Patient Education  2022 [employer].

## 2021-06-18 LAB — CBC WITH DIFFERENTIAL/PLATELET
Absolute Monocytes: 886 cells/uL (ref 200–950)
Basophils Absolute: 86 cells/uL (ref 0–200)
Basophils Relative: 0.8 %
Eosinophils Absolute: 324 cells/uL (ref 15–500)
Eosinophils Relative: 3 %
HCT: 39.4 % (ref 35.0–45.0)
Hemoglobin: 12.7 g/dL (ref 11.7–15.5)
Lymphs Abs: 2808 cells/uL (ref 850–3900)
MCH: 30.4 pg (ref 27.0–33.0)
MCHC: 32.2 g/dL (ref 32.0–36.0)
MCV: 94.3 fL (ref 80.0–100.0)
MPV: 9.8 fL (ref 7.5–12.5)
Monocytes Relative: 8.2 %
Neutro Abs: 6696 cells/uL (ref 1500–7800)
Neutrophils Relative %: 62 %
Platelets: 268 10*3/uL (ref 140–400)
RBC: 4.18 10*6/uL (ref 3.80–5.10)
RDW: 12.2 % (ref 11.0–15.0)
Total Lymphocyte: 26 %
WBC: 10.8 10*3/uL (ref 3.8–10.8)

## 2021-06-18 LAB — PROTEIN ELECTROPHORESIS, SERUM, WITH REFLEX
Albumin ELP: 4 g/dL (ref 3.8–4.8)
Alpha 1: 0.3 g/dL (ref 0.2–0.3)
Alpha 2: 0.7 g/dL (ref 0.5–0.9)
Beta 2: 0.3 g/dL (ref 0.2–0.5)
Beta Globulin: 0.4 g/dL (ref 0.4–0.6)
Gamma Globulin: 0.9 g/dL (ref 0.8–1.7)
Total Protein: 6.6 g/dL (ref 6.1–8.1)

## 2021-06-18 LAB — COMPLETE METABOLIC PANEL WITH GFR
AG Ratio: 1.7 (calc) (ref 1.0–2.5)
ALT: 31 U/L — ABNORMAL HIGH (ref 6–29)
AST: 19 U/L (ref 10–35)
Albumin: 4.2 g/dL (ref 3.6–5.1)
Alkaline phosphatase (APISO): 58 U/L (ref 37–153)
BUN: 13 mg/dL (ref 7–25)
CO2: 27 mmol/L (ref 20–32)
Calcium: 9.3 mg/dL (ref 8.6–10.4)
Chloride: 102 mmol/L (ref 98–110)
Creat: 0.85 mg/dL (ref 0.50–1.03)
Globulin: 2.5 g/dL (calc) (ref 1.9–3.7)
Glucose, Bld: 251 mg/dL — ABNORMAL HIGH (ref 65–99)
Potassium: 5.1 mmol/L (ref 3.5–5.3)
Sodium: 136 mmol/L (ref 135–146)
Total Bilirubin: 0.3 mg/dL (ref 0.2–1.2)
Total Protein: 6.7 g/dL (ref 6.1–8.1)
eGFR: 81 mL/min/{1.73_m2} (ref >=60)

## 2021-06-18 LAB — CYCLIC CITRUL PEPTIDE ANTIBODY, IGG: Cyclic Citrullin Peptide Ab: <16 UNITS

## 2021-06-18 LAB — TSH: TSH: 1.83 mIU/L

## 2021-06-18 LAB — RHEUMATOID FACTOR: Rheumatoid fact SerPl-aCnc: <14 IU/mL (ref <14)

## 2021-06-18 LAB — ANA: Anti Nuclear Antibody (ANA): NEGATIVE

## 2021-06-18 LAB — CK: Total CK: 175 U/L — ABNORMAL HIGH (ref 29–143)

## 2021-06-18 LAB — SEDIMENTATION RATE: Sed Rate: 2 mm/h (ref 0–30)

## 2021-06-18 NOTE — Progress Notes (Signed)
CBC is normal, glucose is elevated, liver function is mildly elevated, sed rate is normal, rheumatoid factor is negative, anti-CCP is negative, TSH is normal, CK is mildly elevated.  ANA is negative, SPEP is normal.  I will discuss results at the follow-up visit.  Please forward labs to her PCP.  Please notify patient of elevated glucose level.

## 2021-07-16 ENCOUNTER — Encounter (HOSPITAL_COMMUNITY): Payer: Self-pay | Admitting: *Deleted

## 2021-07-16 ENCOUNTER — Other Ambulatory Visit: Payer: Self-pay

## 2021-07-16 ENCOUNTER — Emergency Department (HOSPITAL_COMMUNITY)
Admission: EM | Admit: 2021-07-16 | Discharge: 2021-07-16 | Disposition: A | Discharge status: Home / Self Care | Payer: No Typology Code available for payment source | Attending: Emergency Medicine | Admitting: Emergency Medicine

## 2021-07-16 DIAGNOSIS — Z7982 Long term (current) use of aspirin: Secondary | ICD-10-CM | POA: Insufficient documentation

## 2021-07-16 DIAGNOSIS — E119 Type 2 diabetes mellitus without complications: Secondary | ICD-10-CM | POA: Insufficient documentation

## 2021-07-16 DIAGNOSIS — S61210A Laceration without foreign body of right index finger without damage to nail, initial encounter: Secondary | ICD-10-CM | POA: Diagnosis not present

## 2021-07-16 DIAGNOSIS — Z7984 Long term (current) use of oral hypoglycemic drugs: Secondary | ICD-10-CM | POA: Insufficient documentation

## 2021-07-16 DIAGNOSIS — Y990 Civilian activity done for income or pay: Secondary | ICD-10-CM | POA: Insufficient documentation

## 2021-07-16 DIAGNOSIS — Z79899 Other long term (current) drug therapy: Secondary | ICD-10-CM | POA: Insufficient documentation

## 2021-07-16 DIAGNOSIS — F17210 Nicotine dependence, cigarettes, uncomplicated: Secondary | ICD-10-CM | POA: Diagnosis not present

## 2021-07-16 DIAGNOSIS — S6991XA Unspecified injury of right wrist, hand and finger(s), initial encounter: Secondary | ICD-10-CM | POA: Diagnosis present

## 2021-07-16 DIAGNOSIS — Z794 Long term (current) use of insulin: Secondary | ICD-10-CM | POA: Diagnosis not present

## 2021-07-16 DIAGNOSIS — W230XXA Caught, crushed, jammed, or pinched between moving objects, initial encounter: Secondary | ICD-10-CM | POA: Insufficient documentation

## 2021-07-16 DIAGNOSIS — I2510 Atherosclerotic heart disease of native coronary artery without angina pectoris: Secondary | ICD-10-CM | POA: Insufficient documentation

## 2021-07-16 DIAGNOSIS — I10 Essential (primary) hypertension: Secondary | ICD-10-CM | POA: Insufficient documentation

## 2021-07-16 MED ORDER — CEPHALEXIN 500 MG PO CAPS: 500.0000 mg | ORAL_CAPSULE | Freq: Four times a day (QID) | ORAL | 0 refills | Status: DC

## 2021-07-16 MED ORDER — LIDOCAINE HCL (PF) 1 % IJ SOLN
INTRAMUSCULAR | Status: AC
  Filled 2021-07-16: qty 30

## 2021-07-16 MED ORDER — CEPHALEXIN 500 MG PO CAPS
500.0000 mg | ORAL_CAPSULE | Freq: Once | ORAL | Status: AC
  Administered 2021-07-16: 500 mg via ORAL
  Filled 2021-07-16: qty 1

## 2021-07-16 NOTE — Discharge Instructions (Signed)
Keep the finger clean with mild soap and water.  Elevate your hand when possible to help reduce swelling.  Keep it bandaged and splinted.  Sutures will need to be removed in 8 to 10 days.  Please return to the emergency department for any signs of infection such as increasing pain swelling, redness or red streaking.

## 2021-07-16 NOTE — ED Triage Notes (Signed)
Cut to tip of left index finger, bandaged on arrival, injured at work

## 2021-07-16 NOTE — ED Provider Notes (Signed)
[facility] EMERGENCY DEPARTMENT Provider Note   CSN: [id number] Arrival date & time: 07/16/21  1753     History Chief Complaint  Patient presents with   Laceration    Heather Sloan is a 55 y.o. female.   Laceration Associated symptoms: no fever        Heather Sloan is a 55 y.o. female who presents to the Emergency Department complaining of laceration to the distal end of her right index finger.  This is a work-related injury.  She states that her finger was "pinched" on a machine.  Injury occurred around 430 this evening.  She is clean the wound with tap water.  Applied pressure to control bleeding.  She denies numbness or tingling of her finger.  No injuries proximal to the distal finger.  Last Td was 2 years ago.    Past Medical History:  Diagnosis Date   Arthritis    "qwhere" (08/04/2018)   Depression    hx   Fatty liver disease, nonalcoholic    [place] 2013 [place], Hepatitis Labs negative   Frequent sinus infections    GERD (gastroesophageal reflux disease)    EGD March 2018, neg H pylori   Hyperlipidemia    Hypertension    Migraine    "3-4/year now" (08/04/2018)   OSA on CPAP    Pneumonia 2006   "right after I had pneumonia shot"   PVC (premature ventricular contraction)    Holter Monitor  2015   Type II diabetes mellitus (HCC)     Patient Active Problem List   Diagnosis Date Noted   DDD (degenerative disc disease), lumbar 05/14/2019   Carotid artery disease (HCC) 05/14/2019   Chest pain 08/04/2018   Type 2 diabetes mellitus with other specified complication (HCC) 10/13/2017   Hypertension 10/13/2017   Hyperlipidemia associated with type 2 diabetes mellitus (HCC) 10/13/2017   OSA (obstructive sleep apnea) 10/13/2017   Chronic foot pain, right 10/13/2017   Chronic hip pain 10/13/2017   Major depression 10/13/2017   Obesity (BMI 30-39.9) 10/13/2017   Ventricular premature depolarization 12/04/2014   Posterior tibial tendon dysfunction (PTTD) of right lower  extremity 04/23/2013   Gastroesophageal reflux disease 01/07/2011   Cervical dysplasia 01/07/2011    Past Surgical History:  Procedure Laterality Date   FOOT TENDON SURGERY Right 03/2017   "took tendon out; moved tendon behind that tendon to put in it's place; cut heel off, moved heel over, put 3 inch screw in to hold it in place"   LAPAROSCOPIC CHOLECYSTECTOMY  1990s   THROAT SURGERY  2017   "thought I had an abcess; wasn't that; had acute peri-arthritis"     OB History   No obstetric history on file.     Family History  Problem Relation Age of Onset   Diabetes Mother    Diabetes Father    Cancer Father        Abdominal stromal tumor    Heart disease Father    Heart disease Sister        HEART ATTACK   Diabetes Maternal Grandmother    Cancer Paternal Grandfather        Prostate/Colon Cancer   Healthy Son     Social History   Tobacco Use   Smoking status: Every Day    Packs/day: 0.75    Years: 25.00    Pack years: 18.75    Types: Cigarettes    Start date: 10/13/1984   Smokeless tobacco: Never  Vaping Use   Vaping Use:  Never used  Substance Use Topics   Alcohol use: Never   Drug use: Never    Home Medications Prior to Admission medications   Medication Sig Start Date End Date Taking? Authorizing Provider  aspirin 81 MG chewable tablet Chew 81 mg by mouth daily.     [provider]  cholecalciferol (VITAMIN D3) 25 MCG (1000 UNIT) tablet Take 1,000 Units by mouth daily.    [provider]  CINNAMON PO Take by mouth.    [provider]  clobetasol cream (TEMOVATE) 0.05 % Apply 1 application topically 2 (two) times daily. Patient not taking: Reported on 06/16/2021 09/07/19   Salley Scarlet, MD  diclofenac (VOLTAREN) 75 MG EC tablet Take 1 tablet by mouth twice daily 02/11/21   Salley Scarlet, MD  gabapentin (NEURONTIN) 300 MG capsule TAKE 1 CAPSULE BY MOUTH THREE TIMES DAILY 02/12/21   Salley Scarlet, MD  ibuprofen (ADVIL) 200 MG  tablet Take 200 mg by mouth every 6 (six) hours as needed. Patient not taking: Reported on 06/16/2021    [provider]  Insulin Glargine (LANTUS SOLOSTAR) 100 UNIT/ML Solostar Pen Inject 25 Units into the skin at bedtime. Patient taking differently: Inject 28 Units into the skin at bedtime. 12/26/19   Salley Scarlet, MD  Insulin Pen Needle (BD PEN NEEDLE NANO U/F) 32G X 4 MM MISC Use as directed to inject insulin QD. 08/10/18   Danelle Berry, PA-C  lisinopril (ZESTRIL) 20 MG tablet Take 1 tablet by mouth once daily 11/04/20   Salley Scarlet, MD  metFORMIN (GLUCOPHAGE) 1000 MG tablet 500mg  PO Q AM 1000mg  PO Q HS 05/19/20   [place], , MD  methocarbamol (ROBAXIN) 500 MG tablet TAKE 1 TABLET BY MOUTH EVERY 8 HOURS AS NEEDED FOR MUSCLE SPASM 12/29/20   Velna Hatchet, MD  montelukast (SINGULAIR) 10 MG tablet GIVE 1 TABLET BY MOUTH AT BEDTIME AS NEEDED FOR ALLERGIES 11/04/20   Salley Scarlet, MD  omeprazole (PRILOSEC) 40 MG capsule Take 1 capsule by mouth once daily 05/19/20   Salley Scarlet, MD  simvastatin (ZOCOR) 40 MG tablet TAKE 1 TABLET BY MOUTH ONCE DAILY AT  6  PM 01/12/21   Salley Scarlet, MD  TRADJENTA 5 MG TABS tablet Take 1 tablet by mouth once daily 05/19/20   Donita Brooks, MD  traMADol (ULTRAM) 50 MG tablet TAKE 1 TABLET BY MOUTH AT BEDTIME AS NEEDED 11/04/20   Salley Scarlet, MD    Allergies    Codeine, Bactrim [sulfamethoxazole-trimethoprim], and Wheat extract  Review of Systems   Review of Systems  Constitutional:  Negative for chills and fever.  Musculoskeletal:  Negative for arthralgias, back pain and joint swelling.  Skin:  Positive for wound.       Laceration distal right index finger  Neurological:  Negative for dizziness, weakness and numbness.  Hematological:  Does not bruise/bleed easily.   Physical Exam Updated Vital Signs BP (!) 175/100   Pulse 75   Temp 98.5 F (36.9 C) (Oral)   Resp 20   LMP 09/02/2011 (Approximate)   SpO2 97%    Physical Exam Vitals and nursing note reviewed.  Constitutional:      General: She is not in acute distress.    Appearance: Normal appearance.  HENT:     Head: Atraumatic.  Cardiovascular:     Rate and Rhythm: Normal rate and regular rhythm.     Pulses: Normal pulses.  Pulmonary:  Effort: Pulmonary effort is normal.  Musculoskeletal:        General: Signs of injury present. Normal range of motion.     Comments: Patient has full flexion extension at the DIP joint of the right index finger.  Sensation intact.  No tenderness proximal to the DIP.  Skin:    General: Skin is warm.     Capillary Refill: Capillary refill takes less than 2 seconds.     Comments: 2.5 cm laceration to the palmar aspect of the distal right index finger.  No nail involvement.  Nail appears dusky, no subungual hematoma or avulsion of the nail.  Bleeding controlled.  No foreign bodies.  Neurological:     General: No focal deficit present.     Mental Status: She is alert.     Sensory: No sensory deficit.     Motor: No weakness.    ED Results / Procedures / Treatments   Labs (all labs ordered are listed, but only abnormal results are displayed) Labs Reviewed - No data to display  EKG None  Radiology No results found.  Procedures Procedures   LACERATION REPAIR Performed by: Brandey Vandalen Authorized by: Keyarra Rendall Consent: Verbal consent obtained. Risks and benefits: risks, benefits and alternatives were discussed Consent given by: patient Patient identity confirmed: provided demographic data Prepped and Draped in normal sterile fashion Wound explored  Laceration Location: right index finger  Laceration Length: 2.5 cm  No Foreign Bodies seen or palpated  Anesthesia: digital block  Local anesthetic: lidocaine 1% w/o epinephrine  Anesthetic total: 2 ml  Irrigation method: syringe Amount of cleaning: standard  Skin closure: 4-0 prolene  Number of sutures: 5  Technique:  simple interrupted  Patient tolerance: Patient tolerated the procedure well with no immediate complications.   Medications Ordered in ED Medications  lidocaine (PF) (XYLOCAINE) 1 % injection (has no administration in time range)  cephALEXin (KEFLEX) capsule 500 mg (has no administration in time range)    ED Course  I have reviewed the triage vital signs and the nursing notes.  Pertinent labs & imaging results that were available during my care of the patient were reviewed by me and considered in my medical decision making (see chart for details).    MDM Rules/Calculators/A&P                           Patient here with work-related injury that resulted in a laceration to the distal right index finger.  Bleeding controlled prior to arrival.  Neurovascularly intact.  Patient has full range of motion of the finger.  Nail appears dusky without avulsion.  No subungual hematoma.  No laceration involving the nail.  Td is up-to-date.  Recommended x-ray of the finger but patient declined stating that she does not believe she has any bony injuries.  States she will follow-up with orthopedics if needed.  Wound cleaned thoroughly by me using saline and finger was soaked prior to my evaluation with Betadine and saline.  Neurovascularly intact.  Wound explored through range of motion and to the depth of the laceration.  No foreign body seen.  Wound well approximated with sutures.  Patient agreeable to care instructions and close outpatient follow-up.  Sutures out in 8 to 10 days.   Final Clinical Impression(s) / ED Diagnoses Final diagnoses:  Laceration of right index finger without foreign body without damage to nail, initial encounter    Rx / DC Orders ED Discharge Orders  None        Pauline Aus, PA-C 07/16/21 2322    Vanetta Mulders, MD 07/24/21 [phone]

## 2021-07-16 NOTE — Progress Notes (Signed)
In any  Office Visit Note  Patient: Heather Sloan             Date of Birth: 1965/11/19           MRN: [id number]             PCP: Olga Coaster, FNP Referring: Olga Coaster, FNP Visit Date: 07/27/2021 Occupation: @GUAROCC @  Subjective:  Pain in multiple joints   History of Present Illness: Heather Sloan is a 55 y.o. female with a history of pain in multiple joints.  She states she continues to have pain and discomfort in her bilateral hands, bilateral knee joints and her feet.  She has not noticed any joint swelling.  She has difficulty climbing stairs and getting up from the chair due to knee joint discomfort.  She also has some discomfort in the night due to joint pain.  She works as a Furniture conservator/restorer and is on her feet for several hours.  She states she recently had an accident and had laceration to her right finger requiring stitches.  She has been using carpal tunnel braces at nighttime.  I gave her a handout on exercises at the last visit.  She states she has not started doing the exercises yet.  Activities of Daily Living:  Patient reports morning stiffness for 1 hour.   Patient Reports nocturnal pain.  Difficulty dressing/grooming: Reports Difficulty climbing stairs: Reports Difficulty getting out of chair: Reports Difficulty using hands for taps, buttons, cutlery, and/or writing: Reports  Review of Systems  Constitutional:  Positive for fatigue.  HENT:  Negative for mouth sores, mouth dryness and nose dryness.   Eyes:  Negative for pain, itching and dryness.  Respiratory:  Negative for shortness of breath and difficulty breathing.   Cardiovascular:  Negative for chest pain and palpitations.  Gastrointestinal:  Negative for blood in stool, constipation and diarrhea.  Endocrine: Negative for increased urination.  Genitourinary:  Negative for difficulty urinating.  Musculoskeletal:  Positive for joint pain, joint pain and morning stiffness. Negative for joint swelling, myalgias, muscle  tenderness and myalgias.  Skin:  Negative for color change, rash and redness.  Allergic/Immunologic: Negative for susceptible to infections.  Neurological:  Positive for numbness. Negative for dizziness, headaches, memory loss and weakness.  Hematological:  Positive for bruising/bleeding tendency.  Psychiatric/Behavioral:  Negative for confusion.    PMFS History:  Patient Active Problem List   Diagnosis Date Noted   DDD (degenerative disc disease), lumbar 05/14/2019   Carotid artery disease ([place]) 05/14/2019   Chest pain 08/04/2018   Type 2 diabetes mellitus with other specified complication ([place]) 62/69/4854   Hypertension 10/13/2017   Hyperlipidemia associated with type 2 diabetes mellitus ([place]) 10/13/2017   OSA (obstructive sleep apnea) 10/13/2017   Chronic foot pain, right 10/13/2017   Chronic hip pain 10/13/2017   Major depression 10/13/2017   Obesity (BMI 30-39.9) 10/13/2017   Ventricular premature depolarization 12/04/2014   Posterior tibial tendon dysfunction (PTTD) of right lower extremity 04/23/2013   Gastroesophageal reflux disease 01/07/2011   Cervical dysplasia 01/07/2011    Past Medical History:  Diagnosis Date   Arthritis    "qwhere" (08/04/2018)   Depression    hx   Fatty liver disease, nonalcoholic    [place] 6270 [place], Hepatitis Labs negative   Frequent sinus infections    GERD (gastroesophageal reflux disease)    EGD March 2018, neg H pylori   Hyperlipidemia    Hypertension    Migraine    "3-4/year now" (08/04/2018)  OSA on CPAP    Pneumonia 2006   "right after I had pneumonia shot"   PVC (premature ventricular contraction)    Holter Monitor  2015   Type II diabetes mellitus ([place])     Family History  Problem Relation Age of Onset   Diabetes Mother    Diabetes Father    Cancer Father        Abdominal stromal tumor    Heart disease Father    Heart disease Sister        HEART ATTACK   Diabetes Maternal Grandmother    Cancer Paternal Grandfather         Prostate/Colon Cancer   Healthy Son    Past Surgical History:  Procedure Laterality Date   FOOT TENDON SURGERY Right 03/2017   "took tendon out; moved tendon behind that tendon to put in it's place; cut heel off, moved heel over, put 3 inch screw in to hold it in place"   [place]  2017   "thought I had an abcess; wasn't that; had acute peri-arthritis"   Social History   Social History Narrative   Lives with husband   Caffeine- coffee, 1 cup daily, soda 3 cans daily   Immunization History  Administered Date(s) Administered   H1N1 09/24/2008   Influenza Split 08/26/2006, 11/03/2011, 09/09/2015, 09/07/2016   Influenza Whole 08/01/2013   Influenza,inj,Quad PF,6+ Mos 10/13/2017, 07/25/2019, 08/15/2020   Pneumococcal Polysaccharide-23 10/26/2005   Td 06/28/2003   Tdap 06/04/2013, 07/28/2020     Objective: Vital Signs: BP (!) 148/87 (BP Location: Left Arm, Patient Position: Sitting, Cuff Size: Normal)   Pulse 75   Ht 5' 5.5" (1.664 m)   Wt 189 lb (85.7 kg)   LMP 09/02/2011 (Approximate)   BMI 30.97 kg/m    Physical Exam Vitals and nursing note reviewed.  Constitutional:      Appearance: She is well-developed.  HENT:     Head: Normocephalic and atraumatic.  Eyes:     Conjunctiva/sclera: Conjunctivae normal.  Cardiovascular:     Rate and Rhythm: Normal rate and regular rhythm.     Heart sounds: Normal heart sounds.  Pulmonary:     Effort: Pulmonary effort is normal.     Breath sounds: Normal breath sounds.  Abdominal:     General: Bowel sounds are normal.     Palpations: Abdomen is soft.  Musculoskeletal:     Cervical back: Normal range of motion.  Lymphadenopathy:     Cervical: No cervical adenopathy.  Skin:    General: Skin is warm and dry.     Capillary Refill: Capillary refill takes less than 2 seconds.  Neurological:     Mental Status: She is alert and oriented to person, place, and time.  Psychiatric:         Behavior: Behavior normal.     Musculoskeletal Exam: C-spine was in good range of motion.  Shoulder joints, elbow joints, wrist joints with good range of motion.  Her right index finger was wrapped due to injury.  She has PIP and DIP thickening and contractures and some of the PIP joints.  Hip joints and knee joints with good range of motion with no warmth swelling or effusion.  She had crepitus in the bilateral knee joints.  There was no tenderness over ankles or MTPs.  CDAI Exam: CDAI Score: -- Patient Global: --; Provider Global: -- Swollen: --; Tender: -- Joint Exam 07/27/2021   No joint exam has been  documented for this visit   There is currently no information documented on the homunculus. Go to the Rheumatology activity and complete the homunculus joint exam.  Investigation: No additional findings.  Imaging: No results found.  Recent Labs: Lab Results  Component Value Date   WBC 10.8 06/16/2021   HGB 12.7 06/16/2021   PLT 268 06/16/2021   NA 136 06/16/2021   K 5.1 06/16/2021   CL 102 06/16/2021   CO2 27 06/16/2021   GLUCOSE 251 (H) 06/16/2021   BUN 13 06/16/2021   CREATININE 0.85 06/16/2021   BILITOT 0.3 06/16/2021   ALKPHOS 54 08/09/2018   AST 19 06/16/2021   ALT 31 (H) 06/16/2021   PROT 6.7 06/16/2021   PROT 6.6 06/16/2021   ALBUMIN 3.7 08/09/2018   CALCIUM 9.3 06/16/2021   GFRAA 62 09/24/2020   June 16, 2021 SPEP normal, ESR 2, RF negative, anti-CCP negative, ANA negative, TSH normal, CK175     Speciality Comments: No specialty comments available.  Procedures:  No procedures performed Allergies: Codeine, Bactrim [sulfamethoxazole-trimethoprim], and Wheat extract   Assessment / Plan:     Visit Diagnoses: Primary osteoarthritis of both hands - Clinical and radiographic findings consistent with osteoarthritis.  All autoimmune work-up was negative.  Left findings were discussed with the patient.  Detailed counseling osteoarthritis was provided.  Joint  protection muscle strengthening was discussed.  Bilateral carpal tunnel syndrome - Related to work.  She has been using carpal tunnel braces at nighttime.  Lateral epicondylitis of both elbows - She had no tenderness on examination.  I offered tennis elbow braces which she declined.  Chondromalacia of both patellae - Radiographic findings are consistent with chondromalacia patella.  No warmth swelling or effusion was noted.  The x-ray findings were discussed with the patient.  I gave her a handout on lower extremity muscle strengthening exercises at the last visit.  Have advised her to start doing exercises on a regular basis.  Posterior tibial tendon dysfunction (PTTD) of right lower extremity - Status post repair.  She needs a new right ankle joint brace.  I gave her a prescription for right ankle joint brace.  She states she does not have plantar arch and has difficulty walking.  Primary osteoarthritis of both feet - Clinical and radiographic findings are consistent with osteoarthritis.  She continues to have discomfort in her feet.  X-ray findings were discussed with the patient.  Feet exercises were demonstrated in the office.  DDD (degenerative disc disease), cervical - X-ray showed mild spondylosis and facet joint arthropathy.  DDD (degenerative disc disease), lumbar - Diagnosed in [place].  A handout on lumbar spine exercises was given at the last visit.  She was encouraged to do back exercises.  Other fatigue - CK is mildly elevated most likely related to her work.  She had no muscular weakness.  Essential hypertension-blood pressure was a still slightly elevated.  Have advised her to monitor blood pressure closely and follow-up with her PCP.  Other medical problems are listed as follows:  Ventricular premature depolarization  History of hyperlipidemia  Occlusion of left carotid artery  Type 2 diabetes mellitus with other specified complication, with long-term current use of  insulin (HCC)  History of gastroesophageal reflux (GERD)  History of depression  OSA (obstructive sleep apnea)  Orders: No orders of the defined types were placed in this encounter.  No orders of the defined types were placed in this encounter.    Follow-Up Instructions: Return in about 1 year (around  07/27/2022) for Osteoarthritis.   Bo Merino, MD  Note - This record has been created using Editor, commissioning.  Chart creation errors have been sought, but may not always  have been located. Such creation errors do not reflect on  the standard of medical care.

## 2021-07-27 ENCOUNTER — Encounter: Payer: Self-pay | Admitting: Rheumatology

## 2021-07-27 ENCOUNTER — Ambulatory Visit (INDEPENDENT_AMBULATORY_CARE_PROVIDER_SITE_OTHER): Payer: 59 | Admitting: Rheumatology

## 2021-07-27 ENCOUNTER — Other Ambulatory Visit: Payer: Self-pay

## 2021-07-27 VITALS — BP 148/87 | HR 75 | Ht 65.5 in | Wt 189.0 lb

## 2021-07-27 DIAGNOSIS — M19071 Primary osteoarthritis, right ankle and foot: Secondary | ICD-10-CM

## 2021-07-27 DIAGNOSIS — Z8719 Personal history of other diseases of the digestive system: Secondary | ICD-10-CM

## 2021-07-27 DIAGNOSIS — M7712 Lateral epicondylitis, left elbow: Secondary | ICD-10-CM

## 2021-07-27 DIAGNOSIS — M19072 Primary osteoarthritis, left ankle and foot: Secondary | ICD-10-CM

## 2021-07-27 DIAGNOSIS — M76821 Posterior tibial tendinitis, right leg: Secondary | ICD-10-CM

## 2021-07-27 DIAGNOSIS — M19042 Primary osteoarthritis, left hand: Secondary | ICD-10-CM

## 2021-07-27 DIAGNOSIS — M19041 Primary osteoarthritis, right hand: Secondary | ICD-10-CM | POA: Diagnosis not present

## 2021-07-27 DIAGNOSIS — Z8659 Personal history of other mental and behavioral disorders: Secondary | ICD-10-CM

## 2021-07-27 DIAGNOSIS — M2241 Chondromalacia patellae, right knee: Secondary | ICD-10-CM

## 2021-07-27 DIAGNOSIS — M5030 Other cervical disc degeneration, unspecified cervical region: Secondary | ICD-10-CM

## 2021-07-27 DIAGNOSIS — G5603 Carpal tunnel syndrome, bilateral upper limbs: Secondary | ICD-10-CM | POA: Diagnosis not present

## 2021-07-27 DIAGNOSIS — M5136 Other intervertebral disc degeneration, lumbar region: Secondary | ICD-10-CM

## 2021-07-27 DIAGNOSIS — M7711 Lateral epicondylitis, right elbow: Secondary | ICD-10-CM | POA: Diagnosis not present

## 2021-07-27 DIAGNOSIS — Z794 Long term (current) use of insulin: Secondary | ICD-10-CM

## 2021-07-27 DIAGNOSIS — Z8639 Personal history of other endocrine, nutritional and metabolic disease: Secondary | ICD-10-CM

## 2021-07-27 DIAGNOSIS — I6522 Occlusion and stenosis of left carotid artery: Secondary | ICD-10-CM

## 2021-07-27 DIAGNOSIS — M2242 Chondromalacia patellae, left knee: Secondary | ICD-10-CM

## 2021-07-27 DIAGNOSIS — I493 Ventricular premature depolarization: Secondary | ICD-10-CM

## 2021-07-27 DIAGNOSIS — E1169 Type 2 diabetes mellitus with other specified complication: Secondary | ICD-10-CM

## 2021-07-27 DIAGNOSIS — G4733 Obstructive sleep apnea (adult) (pediatric): Secondary | ICD-10-CM

## 2021-07-27 DIAGNOSIS — I10 Essential (primary) hypertension: Secondary | ICD-10-CM

## 2021-07-27 DIAGNOSIS — R5383 Other fatigue: Secondary | ICD-10-CM

## 2021-07-28 ENCOUNTER — Other Ambulatory Visit (HOSPITAL_COMMUNITY): Payer: Self-pay | Admitting: Family Medicine

## 2021-07-28 DIAGNOSIS — Z1231 Encounter for screening mammogram for malignant neoplasm of breast: Secondary | ICD-10-CM

## 2021-11-23 ENCOUNTER — Other Ambulatory Visit: Payer: Self-pay

## 2021-11-23 ENCOUNTER — Ambulatory Visit (HOSPITAL_COMMUNITY)
Admission: RE | Admit: 2021-11-23 | Discharge: 2021-11-23 | Disposition: A | Discharge status: Home / Self Care | Payer: 59 | Source: Ambulatory Visit | Attending: Family Medicine | Admitting: Family Medicine

## 2021-11-23 DIAGNOSIS — Z1231 Encounter for screening mammogram for malignant neoplasm of breast: Secondary | ICD-10-CM | POA: Insufficient documentation

## 2022-07-26 ENCOUNTER — Ambulatory Visit: Payer: 59 | Admitting: Rheumatology

## 2022-12-27 NOTE — Patient Instructions (Signed)

## 2022-12-29 ENCOUNTER — Ambulatory Visit (INDEPENDENT_AMBULATORY_CARE_PROVIDER_SITE_OTHER): Payer: Managed Care, Other (non HMO) | Admitting: Nurse Practitioner

## 2022-12-29 ENCOUNTER — Encounter: Payer: Self-pay | Admitting: Nurse Practitioner

## 2022-12-29 VITALS — BP 116/71 | HR 68 | Ht 65.5 in | Wt 180.8 lb

## 2022-12-29 DIAGNOSIS — Z794 Long term (current) use of insulin: Secondary | ICD-10-CM | POA: Diagnosis not present

## 2022-12-29 DIAGNOSIS — E1165 Type 2 diabetes mellitus with hyperglycemia: Secondary | ICD-10-CM | POA: Diagnosis not present

## 2022-12-29 MED ORDER — LEVEMIR FLEXPEN 100 UNIT/ML ~~LOC~~ SOPN: 25.0000 [IU] | PEN_INJECTOR | Freq: Every day | SUBCUTANEOUS | 3 refills | Status: DC

## 2022-12-29 MED ORDER — DEXCOM G7 SENSOR MISC: 1.0000 | 3 refills | Status: DC

## 2022-12-29 NOTE — Progress Notes (Unsigned)
Endocrinology Consult Note       12/30/2022, 7:29 AM   Subjective:    Patient ID: Heather Sloan, female    DOB: 1966-10-25.  Adaobi Biagioni is being seen in consultation for management of currently uncontrolled symptomatic diabetes requested by  Bucio, Lafayette Dragon, FNP.   Past Medical History:  Diagnosis Date   Arthritis    "qwhere" (08/04/2018)   Depression    hx   Fatty liver disease, nonalcoholic    [place] 0000000 [place], Hepatitis Labs negative   Frequent sinus infections    GERD (gastroesophageal reflux disease)    EGD March 2018, neg H pylori   Hyperlipidemia    Hypertension    Migraine    "3-4/year now" (08/04/2018)   OSA on CPAP    Pneumonia 2006   "right after I had pneumonia shot"   PVC (premature ventricular contraction)    Holter Monitor  2015   Type II diabetes mellitus ([place])     Past Surgical History:  Procedure Laterality Date   FOOT TENDON SURGERY Right 03/2017   "took tendon out; moved tendon behind that tendon to put in it's place; cut heel off, moved heel over, put 3 inch screw in to hold it in place"   [place]  2017   "thought I had an abcess; wasn't that; had acute peri-arthritis"    Social History   Socioeconomic History   Marital status: Married    Spouse name: Clair Gulling   Number of children: 1   Years of education: 12   Highest education level: Not on file  Occupational History    Comment: Glass blower/designer  Tobacco Use   Smoking status: Every Day    Packs/day: 0.75    Years: 25.00    Total pack years: 18.75    Types: Cigarettes    Start date: 10/13/1984   Smokeless tobacco: Never  Vaping Use   Vaping Use: Never used  Substance and Sexual Activity   Alcohol use: Yes    Comment: occ   Drug use: Never   Sexual activity: Not Currently  Other Topics Concern   Not on file  Social History Narrative   Lives with husband   Caffeine- coffee, 1  cup daily, soda 3 cans daily   Social Determinants of Health   Financial Resource Strain: Not on file  Food Insecurity: Not on file  Transportation Needs: Not on file  Physical Activity: Not on file  Stress: Not on file  Social Connections: Not on file    Family History  Problem Relation Age of Onset   Diabetes Mother    Diabetes Father    Cancer Father        Abdominal stromal tumor    Heart disease Father    Heart disease Sister        HEART ATTACK   Diabetes Maternal Grandmother    Cancer Paternal Grandfather        Prostate/Colon Cancer   Healthy Son     Outpatient Encounter Medications as of 12/29/2022  Medication Sig   aspirin 81 MG chewable tablet Chew 81 mg by mouth daily.    cholecalciferol (VITAMIN  D3) 25 MCG (1000 UNIT) tablet Take 1,000 Units by mouth daily.   CINNAMON PO Take by mouth.   clobetasol cream (TEMOVATE) [id number] % Apply 1 application topically 2 (two) times daily. (Patient taking differently: Apply 1 application  topically as needed.)   Continuous Blood Gluc Sensor (DEXCOM G7 SENSOR) MISC Inject 1 Application into the skin as directed. Change sensor every 10 days as directed.   diclofenac (VOLTAREN) 75 MG EC tablet Take 1 tablet by mouth twice daily   gabapentin (NEURONTIN) 300 MG capsule TAKE 1 CAPSULE BY MOUTH THREE TIMES DAILY   hydrOXYzine (ATARAX) 25 MG tablet Take 25 mg by mouth every 8 (eight) hours as needed.   insulin lispro (HUMALOG) 100 UNIT/ML KwikPen Inject 5 Units into the skin 3 (three) times daily. Sliding Scale   lisinopril (ZESTRIL) 20 MG tablet Take 1 tablet by mouth once daily   metFORMIN (GLUCOPHAGE) 1000 MG tablet '500mg'$  PO Q AM '1000mg'$  PO Q HS   methocarbamol (ROBAXIN) 500 MG tablet TAKE 1 TABLET BY MOUTH EVERY 8 HOURS AS NEEDED FOR MUSCLE SPASM   metoprolol succinate (TOPROL-XL) 25 MG 24 hr tablet Take 25 mg by mouth daily.   montelukast (SINGULAIR) 10 MG tablet GIVE 1 TABLET BY MOUTH AT BEDTIME AS NEEDED FOR ALLERGIES   omeprazole  (PRILOSEC) 40 MG capsule Take 1 capsule by mouth once daily   simvastatin (ZOCOR) 40 MG tablet TAKE 1 TABLET BY MOUTH ONCE DAILY AT  6  PM   traMADol (ULTRAM) 50 MG tablet TAKE 1 TABLET BY MOUTH AT BEDTIME AS NEEDED   [DISCONTINUED] FARXIGA 10 MG TABS tablet Take 10 mg by mouth daily.   [DISCONTINUED] LEVEMIR FLEXPEN 100 UNIT/ML FlexPen Inject 20 Units into the skin at bedtime.   LEVEMIR FLEXPEN 100 UNIT/ML FlexPen Inject 25 Units into the skin at bedtime.   [DISCONTINUED] amLODipine (NORVASC) 5 MG tablet Take 5 mg by mouth daily. (Patient not taking: Reported on 12/29/2022)   [DISCONTINUED] cephALEXin (KEFLEX) 500 MG capsule Take 1 capsule (500 mg total) by mouth 4 (four) times daily.   [DISCONTINUED] ibuprofen (ADVIL) 200 MG tablet Take 200 mg by mouth every 6 (six) hours as needed. (Patient not taking: Reported on 06/16/2021)   [DISCONTINUED] Insulin Glargine (LANTUS SOLOSTAR) 100 UNIT/ML Solostar Pen Inject 25 Units into the skin at bedtime. (Patient not taking: Reported on 12/29/2022)   [DISCONTINUED] Insulin Pen Needle (BD PEN NEEDLE NANO U/F) 32G X 4 MM MISC Use as directed to inject insulin QD.   [DISCONTINUED] TRADJENTA 5 MG TABS tablet Take 1 tablet by mouth once daily (Patient not taking: Reported on 12/29/2022)   No facility-administered encounter medications on file as of 12/29/2022.    ALLERGIES: Allergies  Allergen Reactions   Codeine Other (See Comments)    Passed out   Bactrim [Sulfamethoxazole-Trimethoprim] Hives   Wheat Extract Diarrhea and Other (See Comments)    Irritable bowel, bloating    VACCINATION STATUS: Immunization History  Administered Date(s) Administered   H1N1 09/24/2008   Influenza Split 08/26/2006, 11/03/2011, 09/09/2015, 09/07/2016   Influenza Whole 08/01/2013   Influenza,inj,Quad PF,6+ Mos 10/13/2017, 07/25/2019, 08/15/2020   Pneumococcal Polysaccharide-23 10/26/2005   Td 06/28/2003   Tdap 06/04/2013, 07/28/2020    Diabetes She presents for her  initial diabetic visit. She has type 2 diabetes mellitus. Onset time: diagnosed at approx age of 72. Her disease course has been fluctuating. Hypoglycemia symptoms include nervousness/anxiousness, sweats and tremors. Associated symptoms include fatigue, polydipsia and polyuria. There are no hypoglycemic complications.  Symptoms are stable. There are no diabetic complications. Risk factors for coronary artery disease include diabetes mellitus, family history, hypertension and tobacco exposure. Current diabetic treatment includes intensive insulin program and oral agent (dual therapy). She is compliant with treatment most of the time. Her weight is decreasing steadily. She is following a generally unhealthy diet. When asked about meal planning, she reported none. She has not had a previous visit with a dietitian. She participates in exercise intermittently. Her overall blood glucose range is >200 mg/dl. (She presents today for her consultation with her meter showing gross hyperglycemia overall  Her most recent A1c on 11/11/22 was 12.2%.  She monitors glucose 3 times daily.  She drinks Propel, Powerade, Coffee with SF cream and sugar substitute, diet soda, diet tea, and some water.  She eats 3 meals per day and snacks in between if she gets hungry.  She maintains an active lifestyle with walking, does a lot of this at her job.  She is due for eye exam, and routinely follows with podiatrist.  Analysis of her meter shows 7-day average of 210 with 10 readings; 14-day average of 214 with 19 readings; 30-day average of 204 with 51 readings; 90-day average of 214 with 72 readings.  She was prescribed a Dexcom CGM but has not picked it up, does not know where it was sent.) An ACE inhibitor/angiotensin II receptor blocker is being taken. She sees a podiatrist.Eye exam is not current.     Review of systems  Constitutional: + Minimally fluctuating body weight, current Body mass index is 29.63 kg/m., no fatigue, no  subjective hyperthermia, no subjective hypothermia Eyes: no blurry vision, no xerophthalmia ENT: no sore throat, no nodules palpated in throat, no dysphagia/odynophagia, no hoarseness Cardiovascular: no chest pain, no shortness of breath, no palpitations, no leg swelling Respiratory: no cough, no shortness of breath Gastrointestinal: no nausea/vomiting/diarrhea Musculoskeletal: no muscle/joint aches Skin: no rashes, no hyperemia Neurological: no tremors, no numbness, no tingling, no dizziness Psychiatric: no depression, no anxiety  Objective:     BP 116/71 (BP Location: Left Arm, Patient Position: Sitting, Cuff Size: Normal)   Pulse 68   Ht 5' 5.5" (1.664 m)   Wt 180 lb 12.8 oz (82 kg)   LMP 09/02/2011 (Approximate)   BMI 29.63 kg/m   Wt Readings from Last 3 Encounters:  12/29/22 180 lb 12.8 oz (82 kg)  07/27/21 189 lb (85.7 kg)  06/16/21 186 lb (84.4 kg)     BP Readings from Last 3 Encounters:  12/29/22 116/71  07/27/21 (!) 148/87  07/16/21 (!) 175/100     Physical Exam- Limited  Constitutional:  Body mass index is 29.63 kg/m. , not in acute distress, normal state of mind Eyes:  EOMI, no exophthalmos Neck: Supple Cardiovascular: RRR, no murmurs, rubs, or gallops, no edema Respiratory: Adequate breathing efforts, no crackles, rales, rhonchi, or wheezing Musculoskeletal: no gross deformities, strength intact in all four extremities, no gross restriction of joint movements Skin:  no rashes, no hyperemia, nicotinic discoloration to fingernails bilaterally Neurological: no tremor with outstretched hands    CMP ( most recent) CMP     Component Value Date/Time   NA 136 06/16/2021 0908   K 5.1 06/16/2021 0908   CL 102 06/16/2021 0908   CO2 27 06/16/2021 0908   GLUCOSE 251 (H) 06/16/2021 0908   BUN 13 06/16/2021 0908   CREATININE 0.85 06/16/2021 0908   CALCIUM 9.3 06/16/2021 0908   PROT 6.7 06/16/2021 0908   PROT 6.6 06/16/2021  0908   ALBUMIN 3.7 08/09/2018 1107    AST 19 06/16/2021 0908   ALT 31 (H) 06/16/2021 0908   ALKPHOS 54 08/09/2018 1107   BILITOT 0.3 06/16/2021 0908   GFRNONAA 54 (L) 09/24/2020 1529   GFRAA 62 09/24/2020 1529     Diabetic Labs (most recent): Lab Results  Component Value Date   HGBA1C 8.8 (H) 08/15/2020   HGBA1C 7.9 (H) 04/14/2020   HGBA1C 7.4 (H) 12/10/2019   MICROALBUR 0.5 12/10/2019   MICROALBUR 2.5 02/07/2018     Lipid Panel ( most recent) Lipid Panel     Component Value Date/Time   CHOL 144 08/15/2020 1550   TRIG 112 08/15/2020 1550   HDL 39 (L) 08/15/2020 1550   CHOLHDL 3.7 08/15/2020 1550   VLDL 38 08/05/2018 0223   LDLCALC 84 08/15/2020 1550   LDLDIRECT 94 04/14/2020 1546      Lab Results  Component Value Date   TSH 1.83 06/16/2021   TSH 3.835 08/05/2018           Assessment & Plan:   1) Type 2 diabetes mellitus with hyperglycemia, with long-term current use of insulin ([place])  She presents today for her consultation with her meter showing gross hyperglycemia overall  Her most recent A1c on 11/11/22 was 12.2%.  She monitors glucose 3 times daily.  She drinks Propel, Powerade, Coffee with SF cream and sugar substitute, diet soda, diet tea, and some water.  She eats 3 meals per day and snacks in between if she gets hungry.  She maintains an active lifestyle with walking, does a lot of this at her job.  She is due for eye exam, and routinely follows with podiatrist.  Analysis of her meter shows 7-day average of 210 with 10 readings; 14-day average of 214 with 19 readings; 30-day average of 204 with 51 readings; 90-day average of 214 with 72 readings.  She was prescribed a Dexcom CGM but has not picked it up, does not know where it was sent.  - Kirstan Papka has currently uncontrolled symptomatic type 2 DM since 57 years of age, with most recent A1c of 12.2 %.   -Recent labs reviewed.  - I had a long discussion with her about the progressive nature of diabetes and the pathology behind its  complications. -her diabetes is not currently complicated but she remains at a high risk for more acute and chronic complications which include CAD, CVA, CKD, retinopathy, and neuropathy. These are all discussed in detail with her.  The following Lifestyle Medicine recommendations according to [facility]) were discussed and offered to patient and she agrees to start the journey:  A. Whole Foods, Plant-based plate comprising of fruits and vegetables, plant-based proteins, whole-grain carbohydrates was discussed in detail with the patient.   A list for source of those nutrients were also provided to the patient.  Patient will use only water or unsweetened tea for hydration. B.  The need to stay away from risky substances including alcohol, smoking; obtaining 7 to 9 hours of restorative sleep, at least 150 minutes of moderate intensity exercise weekly, the importance of healthy social connections,  and stress reduction techniques were discussed. C.  A full color page of  Calorie density of various food groups per pound showing examples of each food groups was provided to the patient.  - I have counseled her on diet and weight management by adopting a carbohydrate restricted/protein rich diet. Patient is encouraged to switch to unprocessed  or minimally processed complex starch and increased protein intake (animal or plant source), fruits, and vegetables. -  she is advised to stick to a routine mealtimes to eat 3 meals a day and avoid unnecessary snacks (to snack only to correct hypoglycemia).   - she acknowledges that there is a room for improvement in her food and drink choices. - Suggestion is made for her to avoid simple carbohydrates from her diet including Cakes, Sweet Desserts, Ice Cream, Soda (diet and regular), Sweet Tea, Candies, Chips, Cookies, Store Bought Juices, Alcohol in Excess of 1-2 drinks a day, Artificial Sweeteners, Coffee Creamer, and "Sugar-free"  Products. This will help patient to have more stable blood glucose profile and potentially avoid unintended weight gain.  - I have approached her with the following individualized plan to manage her diabetes and patient agrees:   -She is advised to increase her Levemir slightly to 25 units SQ nightly and adjust her Humalog to 6-12 units TID with meals if glucose is above 90 and she is eating (Specific instructions on how to titrate insulin dosage based on glucose readings given to patient in writing).  She demonstrated her ability to properly use the SSI chart to dose meal time insulin with me today.  -she is encouraged to start monitoring glucose 4 times daily, before meals and before bed, and to call the clinic if she has readings less than 70 or above 300 for 3 tests in a row.  She could benefit from CGM device.  I did send script for Dexcom G7 to her local pharmacy.  - she is warned not to take insulin without proper monitoring per orders. - Adjustment parameters are given to her for hypo and hyperglycemia in writing.  - she is advised to continue Metformin 1000 mg po twice daily with meals, therapeutically suitable for patient . - her Wilder Glade will be discontinued, risk outweighs benefit for this patient (she does not have history of CHF or kidney problems).  She did much better on Tradjenta previously which has less side effects.  May look at restarting similar medication in the future.  - she is not an ideal candidate for GLP1 therapy due to heavy smoking history which increases risk of pancreatitis- nor did she tolerate this class of medications in the past.  - Specific targets for  A1c; LDL, HDL, and Triglycerides were discussed with the patient.  2) Blood Pressure /Hypertension:  her blood pressure is controlled to target.   she is advised to continue her current medications including Lisinopril 20 mg p.o. daily with breakfast and Metoprolol 25 mg po daily.  3) Lipids/Hyperlipidemia:     There is no recent lipid panel available to review .  she is advised to continue Simvastatin 40 mg daily at bedtime.  Side effects and precautions discussed with her.  4)  Weight/Diet:  her Body mass index is 29.63 kg/m.  -   she is a candidate for mild weight loss. I discussed with her the fact that loss of 5 - 10% of her  current body weight will have the most impact on her diabetes management.  Exercise, and detailed carbohydrates information provided  -  detailed on discharge instructions.  5) Chronic Care/Health Maintenance: -she is on ACEI/ARB and Statin medications and is encouraged to initiate and continue to follow up with Ophthalmology, Dentist, Podiatrist at least yearly or according to recommendations, and advised to [place]. I have recommended yearly flu vaccine and pneumonia vaccine at least every 5 years;  moderate intensity exercise for up to 150 minutes weekly; and sleep for at least 7 hours a day.  - she is advised to maintain close follow up with Bucio, Lafayette Dragon, FNP for primary care needs, as well as her other providers for optimal and coordinated care.   - Time spent in this patient care: 60 min, of which > 50% was spent in counseling her about her diabetes and the rest reviewing her blood glucose logs, discussing her hypoglycemia and hyperglycemia episodes, reviewing her current and previous labs/studies (including abstraction from other facilities) and medications doses and developing a long term treatment plan based on the latest standards of care/guidelines; and documenting her care.    Please refer to Patient Instructions for Blood Glucose Monitoring and Insulin/Medications Dosing Guide" in media tab for additional information. Please also refer to "Patient Self Inventory" in the Media tab for reviewed elements of pertinent patient history.  Fatima Sanger participated in the discussions, expressed understanding, and voiced agreement with the above plans.  All questions were  answered to her satisfaction. she is encouraged to contact clinic should she have any questions or concerns prior to her return visit.     Follow up plan: - Return in about 3 months (around 03/29/2023) for Diabetes F/U with A1c in office, No previsit labs, Bring meter and logs.    Rayetta Pigg, [facility] [address] [place], [place] [address] Phone: [phone] Fax: [phone]  12/30/2022, 7:29 AM

## 2023-03-11 DIAGNOSIS — J449 Chronic obstructive pulmonary disease, unspecified: Secondary | ICD-10-CM | POA: Diagnosis not present

## 2023-03-11 DIAGNOSIS — I10 Essential (primary) hypertension: Secondary | ICD-10-CM | POA: Diagnosis not present

## 2023-03-11 DIAGNOSIS — F419 Anxiety disorder, unspecified: Secondary | ICD-10-CM | POA: Diagnosis not present

## 2023-03-11 DIAGNOSIS — K219 Gastro-esophageal reflux disease without esophagitis: Secondary | ICD-10-CM | POA: Diagnosis not present

## 2023-03-11 DIAGNOSIS — E1140 Type 2 diabetes mellitus with diabetic neuropathy, unspecified: Secondary | ICD-10-CM | POA: Diagnosis not present

## 2023-03-11 DIAGNOSIS — Z720 Tobacco use: Secondary | ICD-10-CM | POA: Diagnosis not present

## 2023-03-11 DIAGNOSIS — E785 Hyperlipidemia, unspecified: Secondary | ICD-10-CM | POA: Diagnosis not present

## 2023-03-11 DIAGNOSIS — Z809 Family history of malignant neoplasm, unspecified: Secondary | ICD-10-CM | POA: Diagnosis not present

## 2023-03-11 DIAGNOSIS — M1990 Unspecified osteoarthritis, unspecified site: Secondary | ICD-10-CM | POA: Diagnosis not present

## 2023-03-11 DIAGNOSIS — Z833 Family history of diabetes mellitus: Secondary | ICD-10-CM | POA: Diagnosis not present

## 2023-03-11 DIAGNOSIS — Z794 Long term (current) use of insulin: Secondary | ICD-10-CM | POA: Diagnosis not present

## 2023-03-11 DIAGNOSIS — Z7984 Long term (current) use of oral hypoglycemic drugs: Secondary | ICD-10-CM | POA: Diagnosis not present

## 2023-04-05 NOTE — Patient Instructions (Signed)

## 2023-04-06 ENCOUNTER — Ambulatory Visit (INDEPENDENT_AMBULATORY_CARE_PROVIDER_SITE_OTHER): Payer: Managed Care, Other (non HMO) | Admitting: Nurse Practitioner

## 2023-04-06 ENCOUNTER — Encounter: Payer: Self-pay | Admitting: Nurse Practitioner

## 2023-04-06 VITALS — BP 113/75 | HR 68 | Ht 65.5 in | Wt 187.0 lb

## 2023-04-06 DIAGNOSIS — E1165 Type 2 diabetes mellitus with hyperglycemia: Secondary | ICD-10-CM

## 2023-04-06 DIAGNOSIS — Z794 Long term (current) use of insulin: Secondary | ICD-10-CM

## 2023-04-06 DIAGNOSIS — Z7984 Long term (current) use of oral hypoglycemic drugs: Secondary | ICD-10-CM

## 2023-04-06 LAB — POCT UA - MICROALBUMIN
Albumin/Creatinine Ratio, Urine, POC: <30
Microalbumin Ur, POC: 10 mg/L

## 2023-04-06 NOTE — Progress Notes (Signed)
Endocrinology Follow Up Note       04/06/2023, 4:33 PM   Subjective:    Patient ID: Heather Sloan, female    DOB: 1966-06-04.  Heather Sloan is being seen in follow up after being seen in consultation for management of currently uncontrolled symptomatic diabetes requested by  Bucio, Julian Reil, FNP.   Past Medical History:  Diagnosis Date   Arthritis    "qwhere" (08/04/2018)   Depression    hx   Fatty liver disease, nonalcoholic    [place] 2013 [place], Hepatitis Labs negative   Frequent sinus infections    GERD (gastroesophageal reflux disease)    EGD March 2018, neg H pylori   Hyperlipidemia    Hypertension    Migraine    "3-4/year now" (08/04/2018)   OSA on CPAP    Pneumonia 2006   "right after I had pneumonia shot"   PVC (premature ventricular contraction)    Holter Monitor  2015   Type II diabetes mellitus (HCC)     Past Surgical History:  Procedure Laterality Date   FOOT TENDON SURGERY Right 03/2017   "took tendon out; moved tendon behind that tendon to put in it's place; cut heel off, moved heel over, put 3 inch screw in to hold it in place"   LAPAROSCOPIC CHOLECYSTECTOMY  1990s   THROAT SURGERY  2017   "thought I had an abcess; wasn't that; had acute peri-arthritis"    Social History   Socioeconomic History   Marital status: Married    Spouse name: Rosanne Ashing   Number of children: 1   Years of education: 12   Highest education level: Not on file  Occupational History    Comment: Location manager  Tobacco Use   Smoking status: Every Day    Packs/day: 0.75    Years: 25.00    Additional pack years: 0.00    Total pack years: 18.75    Types: Cigarettes    Start date: 10/13/1984   Smokeless tobacco: Never  Vaping Use   Vaping Use: Never used  Substance and Sexual Activity   Alcohol use: Yes    Comment: occ   Drug use: Never   Sexual activity: Not Currently  Other Topics Concern   Not on file  Social  History Narrative   Lives with husband   Caffeine- coffee, 1 cup daily, soda 3 cans daily   Social Determinants of Health   Financial Resource Strain: Not on file  Food Insecurity: Not on file  Transportation Needs: Not on file  Physical Activity: Not on file  Stress: Not on file  Social Connections: Not on file    Family History  Problem Relation Age of Onset   Diabetes Mother    Diabetes Father    Cancer Father        Abdominal stromal tumor    Heart disease Father    Heart disease Sister        HEART ATTACK   Diabetes Maternal Grandmother    Cancer Paternal Grandfather        Prostate/Colon Cancer   Healthy Son     Outpatient Encounter Medications as of 04/06/2023  Medication Sig   aspirin 81  MG chewable tablet Chew 81 mg by mouth daily.    cholecalciferol (VITAMIN D3) 25 MCG (1000 UNIT) tablet Take 1,000 Units by mouth daily.   CINNAMON PO Take by mouth.   clobetasol cream (TEMOVATE) 0.05 % Apply 1 application topically 2 (two) times daily. (Patient taking differently: Apply 1 application  topically as needed.)   Continuous Blood Gluc Sensor (DEXCOM G7 SENSOR) MISC Inject 1 Application into the skin as directed. Change sensor every 10 days as directed.   diclofenac (VOLTAREN) 75 MG EC tablet Take 1 tablet by mouth twice daily   gabapentin (NEURONTIN) 300 MG capsule TAKE 1 CAPSULE BY MOUTH THREE TIMES DAILY   hydrOXYzine (ATARAX) 25 MG tablet Take 25 mg by mouth every 8 (eight) hours as needed.   insulin lispro (HUMALOG) 100 UNIT/ML KwikPen Inject 8 Units into the skin 3 (three) times daily. Sliding Scale   LEVEMIR FLEXPEN 100 UNIT/ML FlexPen Inject 25 Units into the skin at bedtime.   lisinopril (ZESTRIL) 20 MG tablet Take 1 tablet by mouth once daily   metFORMIN (GLUCOPHAGE) 1000 MG tablet 500mg  PO Q AM 1000mg  PO Q HS   methocarbamol (ROBAXIN) 500 MG tablet TAKE 1 TABLET BY MOUTH EVERY 8 HOURS AS NEEDED FOR MUSCLE SPASM   metoprolol succinate (TOPROL-XL) 25 MG 24 hr  tablet Take 25 mg by mouth daily.   montelukast (SINGULAIR) 10 MG tablet GIVE 1 TABLET BY MOUTH AT BEDTIME AS NEEDED FOR ALLERGIES   omeprazole (PRILOSEC) 40 MG capsule Take 1 capsule by mouth once daily   simvastatin (ZOCOR) 40 MG tablet TAKE 1 TABLET BY MOUTH ONCE DAILY AT  6  PM   traMADol (ULTRAM) 50 MG tablet TAKE 1 TABLET BY MOUTH AT BEDTIME AS NEEDED   No facility-administered encounter medications on file as of 04/06/2023.    ALLERGIES: Allergies  Allergen Reactions   Codeine Other (See Comments)    Passed out   Bactrim [Sulfamethoxazole-Trimethoprim] Hives   Wheat Extract Diarrhea and Other (See Comments)    Irritable bowel, bloating    VACCINATION STATUS: Immunization History  Administered Date(s) Administered   H1N1 09/24/2008   Influenza Split 08/26/2006, 11/03/2011, 09/09/2015, 09/07/2016   Influenza Whole 08/01/2013   Influenza,inj,Quad PF,6+ Mos 10/13/2017, 07/25/2019, 08/15/2020   Pneumococcal Polysaccharide-23 10/26/2005   Td 06/28/2003   Tdap 06/04/2013, 07/28/2020    Diabetes She presents for her follow-up diabetic visit. She has type 2 diabetes mellitus. Onset time: diagnosed at approx age of 57. Her disease course has been improving. There are no hypoglycemic associated symptoms. Associated symptoms include fatigue. Pertinent negatives for diabetes include no polydipsia, no polyuria and no weight loss. There are no hypoglycemic complications. Symptoms are improving. There are no diabetic complications. Risk factors for coronary artery disease include diabetes mellitus, family history, hypertension and tobacco exposure. Current diabetic treatment includes intensive insulin program and oral agent (monotherapy). She is compliant with treatment most of the time. Her weight is fluctuating minimally. She is following a generally unhealthy diet. When asked about meal planning, she reported none. She has not had a previous visit with a dietitian. She participates in  exercise intermittently. Her home blood glucose trend is decreasing steadily. Her overall blood glucose range is 140-180 mg/dl. (She presents today with her CGM showing mostly at goal glycemic profile.  Her most recent A1c, checked 2 weeks ago at her PCP office was 8.7%, improving from previous visit of 57.2%.  She is eating healthier, exercising more, and is experimenting with different foods to  know her limits.  She denies any hypoglycemia.  We did have issues connecting her Dexcom G7 to her [employer] account, therefore could not pull her reports.) An ACE inhibitor/angiotensin II receptor blocker is being taken. She sees a podiatrist.Eye exam is not current.     Review of systems  Constitutional: + Minimally fluctuating body weight, current Body mass index is 30.65 kg/m., no fatigue, no subjective hyperthermia, no subjective hypothermia Eyes: no blurry vision, no xerophthalmia ENT: no sore throat, no nodules palpated in throat, no dysphagia/odynophagia, no hoarseness Cardiovascular: no chest pain, no shortness of breath, no palpitations, no leg swelling Respiratory: no cough, no shortness of breath Gastrointestinal: no nausea/vomiting/diarrhea Musculoskeletal: no muscle/joint aches Skin: no rashes, no hyperemia Neurological: no tremors, no numbness, no tingling, no dizziness Psychiatric: no depression, no anxiety  Objective:     BP 113/75 (BP Location: Right Arm, Patient Position: Sitting, Cuff Size: Normal)   Pulse 68   Ht 5' 5.5" (1.664 m)   Wt 187 lb (84.8 kg)   LMP 09/02/2011 (Approximate)   BMI 30.65 kg/m   Wt Readings from Last 3 Encounters:  04/06/23 187 lb (84.8 kg)  12/29/22 180 lb 12.8 oz (82 kg)  07/27/21 189 lb (85.7 kg)     BP Readings from Last 3 Encounters:  04/06/23 113/75  12/29/22 116/71  07/27/21 (!) 148/87     Physical Exam- Limited  Constitutional:  Body mass index is 30.65 kg/m. , not in acute distress, normal state of mind Eyes:  EOMI, no  exophthalmos Musculoskeletal: no gross deformities, strength intact in all four extremities, no gross restriction of joint movements Skin:  no rashes, no hyperemia, nicotinic discoloration to fingernails bilaterally Neurological: no tremor with outstretched hands   Diabetic Foot Exam - Simple   Simple Foot Form Diabetic Foot exam was performed with the following findings: Yes 04/06/2023  4:06 PM  Visual Inspection No deformities, no ulcerations, no other skin breakdown bilaterally: Yes Sensation Testing Intact to touch and monofilament testing bilaterally: Yes Pulse Check Posterior Tibialis and Dorsalis pulse intact bilaterally: Yes Comments Right anterior foot has healing wound (pressure washer injury)     CMP ( most recent) CMP     Component Value Date/Time   NA 136 06/16/2021 0908   K 5.1 06/16/2021 0908   CL 102 06/16/2021 0908   CO2 27 06/16/2021 0908   GLUCOSE 251 (H) 06/16/2021 0908   BUN 13 06/16/2021 0908   CREATININE 0.85 06/16/2021 0908   CALCIUM 9.3 06/16/2021 0908   PROT 6.7 06/16/2021 0908   PROT 6.6 06/16/2021 0908   ALBUMIN 3.7 08/09/2018 1107   AST 19 06/16/2021 0908   ALT 31 (H) 06/16/2021 0908   ALKPHOS 54 08/09/2018 1107   BILITOT 0.3 06/16/2021 0908   GFRNONAA 54 (L) 09/24/2020 1529   GFRAA 62 09/24/2020 1529     Diabetic Labs (most recent): Lab Results  Component Value Date   HGBA1C 8.8 (H) 08/15/2020   HGBA1C 7.9 (H) 04/14/2020   HGBA1C 7.4 (H) 12/10/2019   MICROALBUR 10 mg/L 04/06/2023   MICROALBUR 0.5 12/10/2019   MICROALBUR 2.5 02/07/2018     Lipid Panel ( most recent) Lipid Panel     Component Value Date/Time   CHOL 144 08/15/2020 1550   TRIG 112 08/15/2020 1550   HDL 39 (L) 08/15/2020 1550   CHOLHDL 3.7 08/15/2020 1550   VLDL 38 08/05/2018 0223   LDLCALC 84 08/15/2020 1550   LDLDIRECT 94 04/14/2020 1546  Lab Results  Component Value Date   TSH 1.83 06/16/2021   TSH 3.835 08/05/2018           Assessment &  Plan:   1) Type 2 diabetes mellitus with hyperglycemia, with long-term current use of insulin (HCC)  She presents today with her CGM showing mostly at goal glycemic profile.  Her most recent A1c, checked 2 weeks ago at her PCP office was 8.7%, improving from previous visit of 57.2%.  She is eating healthier, exercising more, and is experimenting with different foods to know her limits.  She denies any hypoglycemia.  We did have issues connecting her Dexcom G7 to her [employer] account, therefore could not pull her reports.  - Heather Sloan has currently uncontrolled symptomatic type 2 DM since 57 years of age.   -Recent labs reviewed.  - I had a long discussion with her about the progressive nature of diabetes and the pathology behind its complications. -her diabetes is not currently complicated but she remains at a high risk for more acute and chronic complications which include CAD, CVA, CKD, retinopathy, and neuropathy. These are all discussed in detail with her.  The following Lifestyle Medicine recommendations according to American College of Lifestyle Medicine [facility]) were discussed and offered to patient and she agrees to start the journey:  A. Whole Foods, Plant-based plate comprising of fruits and vegetables, plant-based proteins, whole-grain carbohydrates was discussed in detail with the patient.   A list for source of those nutrients were also provided to the patient.  Patient will use only water or unsweetened tea for hydration. B.  The need to stay away from risky substances including alcohol, smoking; obtaining 7 to 9 hours of restorative sleep, at least 150 minutes of moderate intensity exercise weekly, the importance of healthy social connections,  and stress reduction techniques were discussed. C.  A full color page of  Calorie density of various food groups per pound showing examples of each food groups was provided to the patient.  - Nutritional counseling repeated at each appointment  due to patients tendency to fall back in to old habits.  - The patient admits there is a room for improvement in their diet and drink choices. -  Suggestion is made for the patient to avoid simple carbohydrates from their diet including Cakes, Sweet Desserts / Pastries, Ice Cream, Soda (diet and regular), Sweet Tea, Candies, Chips, Cookies, Sweet Pastries, Store Bought Juices, Alcohol in Excess of 1-2 drinks a day, Artificial Sweeteners, Coffee Creamer, and "Sugar-free" Products. This will help patient to have stable blood glucose profile and potentially avoid unintended weight gain.   - I encouraged the patient to switch to unprocessed or minimally processed complex starch and increased protein intake (animal or plant source), fruits, and vegetables.   - Patient is advised to stick to a routine mealtimes to eat 3 meals a day and avoid unnecessary snacks (to snack only to correct hypoglycemia).  - I have approached her with the following individualized plan to manage her diabetes and patient agrees:   -She is advised to continue her Levemir 25 units SQ nightly and adjust her Humalog to 8-11 units TID with meals if glucose is above 90 and she is eating (Specific instructions on how to titrate insulin dosage based on glucose readings given to patient in writing).  She can continue Metformin 1000 mg po twice daily with meals.  -she is encouraged to start monitoring glucose 4 times daily, before meals and before bed (using her  CGM), and to call the clinic if she has readings less than 70 or above 300 for 3 tests in a row.    - she is warned not to take insulin without proper monitoring per orders. - Adjustment parameters are given to her for hypo and hyperglycemia in writing.  - her Marcelline Deist will be discontinued, risk outweighs benefit for this patient (she does not have history of CHF or kidney problems).  She did much better on Tradjenta previously which has less side effects.  May look at restarting  similar medication in the future.  - she is not an ideal candidate for GLP1 therapy due to heavy smoking history which increases risk of pancreatitis- nor did she tolerate this class of medications in the past.  - Specific targets for  A1c; LDL, HDL, and Triglycerides were discussed with the patient.  2) Blood Pressure /Hypertension:  her blood pressure is controlled to target.   she is advised to continue her current medications including Lisinopril 20 mg p.o. daily with breakfast and Metoprolol 25 mg po daily.  3) Lipids/Hyperlipidemia:    There is no recent lipid panel available to review .  she is advised to continue Simvastatin 40 mg daily at bedtime.  Side effects and precautions discussed with her.  4)  Weight/Diet:  her Body mass index is 30.65 kg/m.  -   she is a candidate for mild weight loss. I discussed with her the fact that loss of 5 - 10% of her  current body weight will have the most impact on her diabetes management.  Exercise, and detailed carbohydrates information provided  -  detailed on discharge instructions.  5) Chronic Care/Health Maintenance: -she is on ACEI/ARB and Statin medications and is encouraged to initiate and continue to follow up with Ophthalmology, Dentist, Podiatrist at least yearly or according to recommendations, and advised to QUIT SMOKING. I have recommended yearly flu vaccine and pneumonia vaccine at least every 5 years; moderate intensity exercise for up to 150 minutes weekly; and sleep for at least 7 hours a day.  - she is advised to maintain close follow up with Bucio, Julian Reil, FNP for primary care needs, as well as her other providers for optimal and coordinated care.     I spent  38  minutes in the care of the patient today including review of labs from CMP, Lipids, Thyroid Function, Hematology (current and previous including abstractions from other facilities); face-to-face time discussing  her blood glucose readings/logs, discussing hypoglycemia  and hyperglycemia episodes and symptoms, medications doses, her options of short and long term treatment based on the latest standards of care / guidelines;  discussion about incorporating lifestyle medicine;  and documenting the encounter. Risk reduction counseling performed per USPSTF guidelines to reduce obesity and cardiovascular risk factors.     Please refer to Patient Instructions for Blood Glucose Monitoring and Insulin/Medications Dosing Guide"  in media tab for additional information. Please  also refer to " Patient Self Inventory" in the Media  tab for reviewed elements of pertinent patient history.  Arlana Pouch participated in the discussions, expressed understanding, and voiced agreement with the above plans.  All questions were answered to her satisfaction. she is encouraged to contact clinic should she have any questions or concerns prior to her return visit.     Follow up plan: - Return in about 3 months (around 07/07/2023) for Diabetes F/U with A1c in office, No previsit labs, Bring meter and logs.   Ronny Bacon, FNP-BC  [facility] [address] [place], [place] [address] Phone: [phone] Fax: [phone]  04/06/2023, 4:33 PM

## 2023-05-10 ENCOUNTER — Ambulatory Visit (INDEPENDENT_AMBULATORY_CARE_PROVIDER_SITE_OTHER): Payer: 59 | Admitting: Pulmonary Disease

## 2023-05-10 ENCOUNTER — Encounter: Payer: Self-pay | Admitting: Pulmonary Disease

## 2023-05-10 VITALS — BP 134/79 | HR 68 | Ht 65.5 in | Wt 190.0 lb

## 2023-05-10 DIAGNOSIS — G4733 Obstructive sleep apnea (adult) (pediatric): Secondary | ICD-10-CM | POA: Diagnosis not present

## 2023-05-10 DIAGNOSIS — J310 Chronic rhinitis: Secondary | ICD-10-CM

## 2023-05-10 NOTE — Patient Instructions (Signed)
Will try to get a copy of your sleep study from [facility] and have them arrange for a new CPAP machine.  You can try using flonase or astepro nasal sprays as needed to help with sinus congestion.  You can get these over the counter without a prescription.  Follow up in 4 months.

## 2023-05-10 NOTE — Progress Notes (Signed)
[facility]  Chief Complaint  Patient presents with   Consult    Past Surgical History:  She  has a past surgical history that includes Laparoscopic cholecystectomy (1990s); Throat surgery (2017); and Foot Tendon Surgery (Right, 03/2017).  Past Medical History:  Arthritis, Depression, NASH, GERD, HLD, HTN, PNA, DM type 2  Constitutional:  BP 134/79   Pulse 68   Ht 5' 5.5" (1.664 m)   Wt 190 lb (86.2 kg)   LMP 09/02/2011 (Approximate)   SpO2 96%   BMI 31.14 kg/m   Brief Summary:  Heather Sloan is a 57 y.o. female with obstructive sleep apnea.      Subjective:   Her husband is followed in clinic for sleep apnea also.  She was living in [place] and had a sleep study there.  Prior to this she would snore, feel tired and stop breathing while asleep.  She has been using CPAP every since.  She moved to [facility] in 2018.  She has been getting supplies through [facility].  No issues with mask fit or pressure setting.  She does get sinus congestion and pressure.  Has been using nasal irrigation.  She goes to sleep at 8 pm.  She falls asleep quickly.  She wakes up 1 or 2 times to use the bathroom.  She gets out of bed at 4 to 430 am.  She feels okay in the morning.  She denies morning headache.  She does not use anything to help her fall sleep or stay awake.  She denies sleep walking, sleep talking, bruxism, or nightmares.  There is no history of restless legs.  She denies sleep hallucinations, sleep paralysis, or cataplexy.  The Epworth score is 9 out of 24.   Physical Exam:   Appearance - well kempt   ENMT - no sinus tenderness, no oral exudate, no LAN, Mallampati 3 airway, no stridor, elongated uvula, 2 plus tonsils  Respiratory - equal breath sounds bilaterally, no wheezing or rales  CV - s1s2 regular rate and rhythm, no murmurs  Ext - no clubbing, no edema  Skin - no rashes  Psych - normal mood and affect   Sleep Tests:    Social  History:  She  reports that she has been smoking cigarettes. She started smoking about 38 years ago. She has a 18.75 pack-year smoking history. She has never used smokeless tobacco. She reports current alcohol use. She reports that she does not use drugs.  Family History:  Her family history includes Cancer in her father and paternal grandfather; Diabetes in her father, maternal grandmother, and mother; Healthy in her son; Heart disease in her father and sister.     Assessment/Plan:   Obstructive sleep apnea. - discussed how sleep apnea can impact her health - driving precautions reviewed - discussed treatment options - she is compliant with CPAP and reports benefit from therapy - she uses [facility] for her DME - her current CPAP is more than 57 yrs old - will try to get a copy of her sleep study from [facility] - will arrange for a new Resmed auto CPAP 5 to 15 cm H2O  CPAP rhinitis. - she plans to get a Navage device for nasal irrigation - she can try prn flonase or astepro  Time Spent Involved in Patient Care on Day of Examination:  36 minutes  Follow up:   Patient Instructions  Will try to get a copy of your sleep study from [facility] and  have them arrange for a new CPAP machine.  You can try using flonase or astepro nasal sprays as needed to help with sinus congestion.  You can get these over the counter without a prescription.  Follow up in 4 months.  Medication List:   Allergies as of 05/10/2023       Reactions   Codeine Other (See Comments)   Passed out   Bactrim [sulfamethoxazole-trimethoprim] Hives   Wheat Extract Diarrhea, Other (See Comments)   Irritable bowel, bloating        Medication List        Accurate as of May 10, 2023  3:05 PM. If you have any questions, ask your nurse or doctor.          aspirin 81 MG chewable tablet Chew 81 mg by mouth daily.   cholecalciferol 25 MCG (1000 UNIT) tablet Commonly known as: VITAMIN D3 Take 1,000 Units by mouth  daily.   CINNAMON PO Take by mouth.   clobetasol cream 0.05 % Commonly known as: TEMOVATE Apply 1 application topically 2 (two) times daily. What changed:  when to take this reasons to take this   Coenzyme Q10 400 MG Caps Take by mouth.   Dexcom G7 Sensor Misc Inject 1 Application into the skin as directed. Change sensor every 10 days as directed.   diclofenac 75 MG EC tablet Commonly known as: VOLTAREN Take 1 tablet by mouth twice daily   gabapentin 300 MG capsule Commonly known as: NEURONTIN TAKE 1 CAPSULE BY MOUTH THREE TIMES DAILY   hydrOXYzine 25 MG tablet Commonly known as: ATARAX Take 25 mg by mouth every 8 (eight) hours as needed.   insulin lispro 100 UNIT/ML KwikPen Commonly known as: HUMALOG Inject 8 Units into the skin 3 (three) times daily. Sliding Scale   Levemir FlexPen 100 UNIT/ML FlexPen Generic drug: insulin detemir Inject 25 Units into the skin at bedtime.   lisinopril 20 MG tablet Commonly known as: ZESTRIL Take 1 tablet by mouth once daily   metFORMIN 1000 MG tablet Commonly known as: GLUCOPHAGE 500mg  PO Q AM 1000mg  PO Q HS   methocarbamol 500 MG tablet Commonly known as: ROBAXIN TAKE 1 TABLET BY MOUTH EVERY 8 HOURS AS NEEDED FOR MUSCLE SPASM   metoprolol succinate 25 MG 24 hr tablet Commonly known as: TOPROL-XL Take 25 mg by mouth daily.   montelukast 10 MG tablet Commonly known as: SINGULAIR GIVE 1 TABLET BY MOUTH AT BEDTIME AS NEEDED FOR ALLERGIES   omeprazole 40 MG capsule Commonly known as: PRILOSEC Take 1 capsule by mouth once daily   sertraline 50 MG tablet Commonly known as: ZOLOFT Take 50 mg by mouth daily.   simvastatin 40 MG tablet Commonly known as: ZOCOR TAKE 1 TABLET BY MOUTH ONCE DAILY AT  6  PM   traMADol 50 MG tablet Commonly known as: ULTRAM TAKE 1 TABLET BY MOUTH AT BEDTIME AS NEEDED        Signature:  Coralyn Helling, MD [facility] Pager - [phone] 05/10/2023, 3:05 PM

## 2023-05-26 DIAGNOSIS — H3540 Unspecified peripheral retinal degeneration: Secondary | ICD-10-CM | POA: Diagnosis not present

## 2023-06-14 DIAGNOSIS — F419 Anxiety disorder, unspecified: Secondary | ICD-10-CM | POA: Diagnosis not present

## 2023-06-14 DIAGNOSIS — M6740 Ganglion, unspecified site: Secondary | ICD-10-CM | POA: Diagnosis not present

## 2023-06-14 DIAGNOSIS — E1165 Type 2 diabetes mellitus with hyperglycemia: Secondary | ICD-10-CM | POA: Diagnosis not present

## 2023-06-14 DIAGNOSIS — Z0000 Encounter for general adult medical examination without abnormal findings: Secondary | ICD-10-CM | POA: Diagnosis not present

## 2023-06-14 DIAGNOSIS — E785 Hyperlipidemia, unspecified: Secondary | ICD-10-CM | POA: Diagnosis not present

## 2023-06-14 DIAGNOSIS — Z713 Dietary counseling and surveillance: Secondary | ICD-10-CM | POA: Diagnosis not present

## 2023-06-14 DIAGNOSIS — L0231 Cutaneous abscess of buttock: Secondary | ICD-10-CM | POA: Diagnosis not present

## 2023-06-14 DIAGNOSIS — I10 Essential (primary) hypertension: Secondary | ICD-10-CM | POA: Diagnosis not present

## 2023-06-14 DIAGNOSIS — R102 Pelvic and perineal pain: Secondary | ICD-10-CM | POA: Diagnosis not present

## 2023-06-14 DIAGNOSIS — Z7182 Exercise counseling: Secondary | ICD-10-CM | POA: Diagnosis not present

## 2023-06-14 DIAGNOSIS — E669 Obesity, unspecified: Secondary | ICD-10-CM | POA: Diagnosis not present

## 2023-06-14 DIAGNOSIS — G8929 Other chronic pain: Secondary | ICD-10-CM | POA: Diagnosis not present

## 2023-07-05 DIAGNOSIS — Z1231 Encounter for screening mammogram for malignant neoplasm of breast: Secondary | ICD-10-CM | POA: Diagnosis not present

## 2023-07-07 ENCOUNTER — Ambulatory Visit: Payer: Managed Care, Other (non HMO) | Admitting: Nurse Practitioner

## 2023-07-07 ENCOUNTER — Encounter: Payer: Self-pay | Admitting: Nurse Practitioner

## 2023-07-07 VITALS — BP 136/54 | HR 71 | Ht 65.5 in | Wt 194.0 lb

## 2023-07-07 DIAGNOSIS — E1165 Type 2 diabetes mellitus with hyperglycemia: Secondary | ICD-10-CM

## 2023-07-07 DIAGNOSIS — Z794 Long term (current) use of insulin: Secondary | ICD-10-CM | POA: Diagnosis not present

## 2023-07-07 DIAGNOSIS — Z7984 Long term (current) use of oral hypoglycemic drugs: Secondary | ICD-10-CM

## 2023-07-07 LAB — POCT GLYCOSYLATED HEMOGLOBIN (HGB A1C): Hemoglobin A1C: 8.5 % — AB (ref 4.0–5.6)

## 2023-07-07 MED ORDER — LINAGLIPTIN 5 MG PO TABS: 5.0000 mg | ORAL_TABLET | Freq: Every day | ORAL | 3 refills | Status: DC

## 2023-07-07 NOTE — Progress Notes (Signed)
Endocrinology Follow Up Note       07/07/2023, 4:16 PM   Subjective:    Patient ID: Heather Sloan, female    DOB: September 10, 1966.  Heather Sloan is being seen in follow up after being seen in consultation for management of currently uncontrolled symptomatic diabetes requested by  Bucio, Julian Reil, FNP.   Past Medical History:  Diagnosis Date   Arthritis    "qwhere" (08/04/2018)   Depression    hx   Fatty liver disease, nonalcoholic    [place] 2013 [place], Hepatitis Labs negative   Frequent sinus infections    GERD (gastroesophageal reflux disease)    EGD March 2018, neg H pylori   Hyperlipidemia    Hypertension    Migraine    "3-4/year now" (08/04/2018)   OSA on CPAP    Pneumonia 2006   "right after I had pneumonia shot"   PVC (premature ventricular contraction)    Holter Monitor  2015   Type II diabetes mellitus (HCC)     Past Surgical History:  Procedure Laterality Date   FOOT TENDON SURGERY Right 03/2017   "took tendon out; moved tendon behind that tendon to put in it's place; cut heel off, moved heel over, put 3 inch screw in to hold it in place"   LAPAROSCOPIC CHOLECYSTECTOMY  1990s   THROAT SURGERY  2017   "thought I had an abcess; wasn't that; had acute peri-arthritis"    Social History   Socioeconomic History   Marital status: Married    Spouse name: Rosanne Ashing   Number of children: 1   Years of education: 12   Highest education level: Not on file  Occupational History    Comment: Location manager  Tobacco Use   Smoking status: Every Day    Current packs/day: 0.75    Average packs/day: 0.7 packs/day for 38.7 years (29.0 ttl pk-yrs)    Types: Cigarettes    Start date: 10/13/1984   Smokeless tobacco: Never  Vaping Use   Vaping status: Never Used  Substance and Sexual Activity   Alcohol use: Yes    Comment: occ   Drug use: Never   Sexual activity: Not Currently  Other Topics Concern   Not on file   Social History Narrative   Lives with husband   Caffeine- coffee, 1 cup daily, soda 3 cans daily   Social Determinants of Health   Financial Resource Strain: Not on file  Food Insecurity: Not on file  Transportation Needs: No Transportation Needs (08/17/2022)   Received from [facility]   PRAPARE - Transportation    Lack of Transportation (Medical): No    Lack of Transportation (Non-Medical): No  Physical Activity: Not on file  Stress: Not on file  Social Connections: Not on file    Family History  Problem Relation Age of Onset   Diabetes Mother    Diabetes Father    Cancer Father        Abdominal stromal tumor    Heart disease Father    Heart disease Sister        HEART ATTACK   Diabetes Maternal Grandmother    Cancer Paternal Grandfather  Prostate/Colon Cancer   Healthy Son     Outpatient Encounter Medications as of 07/07/2023  Medication Sig   aspirin 81 MG chewable tablet Chew 81 mg by mouth daily.    cholecalciferol (VITAMIN D3) 25 MCG (1000 UNIT) tablet Take 1,000 Units by mouth daily.   CINNAMON PO Take by mouth.   clobetasol cream (TEMOVATE) 0.05 % Apply 1 application topically 2 (two) times daily. (Patient taking differently: Apply 1 application  topically as needed.)   Coenzyme Q10 400 MG CAPS Take by mouth.   Continuous Blood Gluc Sensor (DEXCOM G7 SENSOR) MISC Inject 1 Application into the skin as directed. Change sensor every 10 days as directed.   diclofenac (VOLTAREN) 75 MG EC tablet Take 1 tablet by mouth twice daily   gabapentin (NEURONTIN) 300 MG capsule TAKE 1 CAPSULE BY MOUTH THREE TIMES DAILY   hydrOXYzine (ATARAX) 25 MG tablet Take 25 mg by mouth every 8 (eight) hours as needed.   insulin lispro (HUMALOG) 100 UNIT/ML KwikPen Inject 8 Units into the skin 3 (three) times daily. Sliding Scale   LEVEMIR FLEXPEN 100 UNIT/ML FlexPen Inject 25 Units into the skin at bedtime.   linagliptin (TRADJENTA) 5 MG TABS tablet Take 1  tablet (5 mg total) by mouth daily.   lisinopril (ZESTRIL) 20 MG tablet Take 1 tablet by mouth once daily   metFORMIN (GLUCOPHAGE) 1000 MG tablet 500mg  PO Q AM 1000mg  PO Q HS   methocarbamol (ROBAXIN) 500 MG tablet TAKE 1 TABLET BY MOUTH EVERY 8 HOURS AS NEEDED FOR MUSCLE SPASM   metoprolol succinate (TOPROL-XL) 25 MG 24 hr tablet Take 25 mg by mouth daily.   montelukast (SINGULAIR) 10 MG tablet GIVE 1 TABLET BY MOUTH AT BEDTIME AS NEEDED FOR ALLERGIES   omeprazole (PRILOSEC) 40 MG capsule Take 1 capsule by mouth once daily   sertraline (ZOLOFT) 50 MG tablet Take 50 mg by mouth daily.   simvastatin (ZOCOR) 40 MG tablet TAKE 1 TABLET BY MOUTH ONCE DAILY AT  6  PM   traMADol (ULTRAM) 50 MG tablet TAKE 1 TABLET BY MOUTH AT BEDTIME AS NEEDED   No facility-administered encounter medications on file as of 07/07/2023.    ALLERGIES: Allergies  Allergen Reactions   Codeine Other (See Comments)    Passed out   Bactrim [Sulfamethoxazole-Trimethoprim] Hives   Wheat Extract Diarrhea and Other (See Comments)    Irritable bowel, bloating    VACCINATION STATUS: Immunization History  Administered Date(s) Administered   H1N1 09/24/2008   Influenza Split 08/26/2006, 11/03/2011, 09/09/2015, 09/07/2016   Influenza Whole 08/01/2013   Influenza,inj,Quad PF,6+ Mos 10/13/2017, 07/25/2019, 08/15/2020   Pneumococcal Polysaccharide-23 10/26/2005   Td 06/28/2003   Tdap 06/04/2013, 07/28/2020    Diabetes She presents for her follow-up diabetic visit. She has type 2 diabetes mellitus. Onset time: diagnosed at approx age of 20. Her disease course has been improving. There are no hypoglycemic associated symptoms. Associated symptoms include fatigue. Pertinent negatives for diabetes include no polydipsia, no polyuria and no weight loss. There are no hypoglycemic complications. Symptoms are improving. There are no diabetic complications. Risk factors for coronary artery disease include diabetes mellitus, family  history, hypertension and tobacco exposure. Current diabetic treatment includes intensive insulin program and oral agent (monotherapy). She is compliant with treatment most of the time. Her weight is fluctuating minimally. She is following a generally unhealthy diet. When asked about meal planning, she reported none. She has not had a previous visit with a dietitian. She participates in exercise intermittently.  Her home blood glucose trend is decreasing steadily. Her overall blood glucose range is >200 mg/dl. (She presents today with her CGM showing dramatically fluctuating glycemic profile.  Her POCT A1c today is 8.5%, improving from last visit of 8.7%.  Analysis of her CGM shows TIR 36%, TAR 64%, TBR 0% with a GMI of 8.5%.  Since stopping the [place] last visit she has been playing with her short-acting insulin to help cover spikes in glucose.  She does have sudden drops in glucose after such, which she tends to over-correct.  ) An ACE inhibitor/angiotensin II receptor blocker is being taken. She sees a podiatrist.Eye exam is not current.    Review of systems  Constitutional: + Minimally fluctuating body weight,  current Body mass index is 31.79 kg/m. , no fatigue, no subjective hyperthermia, no subjective hypothermia Eyes: no blurry vision, no xerophthalmia ENT: no sore throat, no nodules palpated in throat, no dysphagia/odynophagia, no hoarseness Cardiovascular: no chest pain, no shortness of breath, no palpitations, no leg swelling Respiratory: no cough, no shortness of breath Gastrointestinal: no nausea/vomiting/diarrhea Musculoskeletal: no muscle/joint aches Skin: no rashes, no hyperemia Neurological: no tremors, no numbness, no tingling, no dizziness Psychiatric: no depression, no anxiety  Objective:     BP (!) 136/54 (BP Location: Left Arm, Patient Position: Sitting, Cuff Size: Large)   Pulse 71   Ht 5' 5.5" (1.664 m)   Wt 194 lb (88 kg)   LMP 09/02/2011 (Approximate)   BMI 31.79  kg/m   Wt Readings from Last 3 Encounters:  07/07/23 194 lb (88 kg)  05/10/23 190 lb (86.2 kg)  04/06/23 187 lb (84.8 kg)     BP Readings from Last 3 Encounters:  07/07/23 (!) 136/54  05/10/23 134/79  04/06/23 113/75     Physical Exam- Limited  Constitutional:  Body mass index is 31.79 kg/m. , not in acute distress, normal state of mind Eyes:  EOMI, no exophthalmos Musculoskeletal: no gross deformities, strength intact in all four extremities, no gross restriction of joint movements Skin:  no rashes, no hyperemia, nicotinic discoloration to fingernails bilaterally Neurological: no tremor with outstretched hands   Diabetic Foot Exam - Simple   No data filed     CMP ( most recent) CMP     Component Value Date/Time   NA 136 06/16/2021 0908   K 5.1 06/16/2021 0908   CL 102 06/16/2021 0908   CO2 27 06/16/2021 0908   GLUCOSE 251 (H) 06/16/2021 0908   BUN 13 06/16/2021 0908   CREATININE 0.85 06/16/2021 0908   CALCIUM 9.3 06/16/2021 0908   PROT 6.7 06/16/2021 0908   PROT 6.6 06/16/2021 0908   ALBUMIN 3.7 08/09/2018 1107   AST 19 06/16/2021 0908   ALT 31 (H) 06/16/2021 0908   ALKPHOS 54 08/09/2018 1107   BILITOT 0.3 06/16/2021 0908   GFRNONAA 54 (L) 09/24/2020 1529   GFRAA 62 09/24/2020 1529     Diabetic Labs (most recent): Lab Results  Component Value Date   HGBA1C 8.5 (A) 07/07/2023   HGBA1C 8.8 (H) 08/15/2020   HGBA1C 7.9 (H) 04/14/2020   MICROALBUR 10 mg/L 04/06/2023   MICROALBUR 0.5 12/10/2019   MICROALBUR 2.5 02/07/2018     Lipid Panel ( most recent) Lipid Panel     Component Value Date/Time   CHOL 144 08/15/2020 1550   TRIG 112 08/15/2020 1550   HDL 39 (L) 08/15/2020 1550   CHOLHDL 3.7 08/15/2020 1550   VLDL 38 08/05/2018 0223   LDLCALC 84 08/15/2020 1550  LDLDIRECT 94 04/14/2020 1546      Lab Results  Component Value Date   TSH 1.83 06/16/2021   TSH 3.835 08/05/2018                      Assessment & Plan:   1) Type 2  diabetes mellitus with hyperglycemia, with long-term current use of insulin (HCC)  She presents today with her CGM showing dramatically fluctuating glycemic profile.  Her POCT A1c today is 8.5%, improving from last visit of 8.7%.  Analysis of her CGM shows TIR 36%, TAR 64%, TBR 0% with a GMI of 8.5%.  Since stopping the [place] last visit she has been playing with her short-acting insulin to help cover spikes in glucose.  She does have sudden drops in glucose after such, which she tends to over-correct.    - Heather Sloan has currently uncontrolled symptomatic type 2 DM since 57 years of age.   -Recent labs reviewed.  - I had a long discussion with her about the progressive nature of diabetes and the pathology behind its complications. -her diabetes is not currently complicated but she remains at a high risk for more acute and chronic complications which include CAD, CVA, CKD, retinopathy, and neuropathy. These are all discussed in detail with her.  The following Lifestyle Medicine recommendations according to American College of Lifestyle Medicine [facility]) were discussed and offered to patient and she agrees to start the journey:  A. Whole Foods, Plant-based plate comprising of fruits and vegetables, plant-based proteins, whole-grain carbohydrates was discussed in detail with the patient.   A list for source of those nutrients were also provided to the patient.  Patient will use only water or unsweetened tea for hydration. B.  The need to stay away from risky substances including alcohol, smoking; obtaining 7 to 9 hours of restorative sleep, at least 150 minutes of moderate intensity exercise weekly, the importance of healthy social connections,  and stress reduction techniques were discussed. C.  A full color page of  Calorie density of various food groups per pound showing examples of each food groups was provided to the patient.  - Nutritional counseling repeated at each appointment due to patients  tendency to fall back in to old habits.  - The patient admits there is a room for improvement in their diet and drink choices. -  Suggestion is made for the patient to avoid simple carbohydrates from their diet including Cakes, Sweet Desserts / Pastries, Ice Cream, Soda (diet and regular), Sweet Tea, Candies, Chips, Cookies, Sweet Pastries, Store Bought Juices, Alcohol in Excess of 1-2 drinks a day, Artificial Sweeteners, Coffee Creamer, and "Sugar-free" Products. This will help patient to have stable blood glucose profile and potentially avoid unintended weight gain.   - I encouraged the patient to switch to unprocessed or minimally processed complex starch and increased protein intake (animal or plant source), fruits, and vegetables.   - Patient is advised to stick to a routine mealtimes to eat 3 meals a day and avoid unnecessary snacks (to snack only to correct hypoglycemia).  - I have approached her with the following individualized plan to manage her diabetes and patient agrees:   -She is advised to continue her Levemir 25 units SQ nightly and adjust her Humalog back to 8-11 units TID with meals if glucose is above 90 and she is eating (Specific instructions on how to titrate insulin dosage based on glucose readings given to patient in writing).  She can continue  Metformin 1000 mg po twice daily with meals.  I discussed and initiated Tradjenta 5 mg po daily (she did well on this medication in the past).  She is concerned that insurance may not cover it.  -she is encouraged to start monitoring glucose 4 times daily, before meals and before bed (using her CGM), and to call the clinic if she has readings less than 70 or above 300 for 3 tests in a row.    - she is warned not to take insulin without proper monitoring per orders. - Adjustment parameters are given to her for hypo and hyperglycemia in writing.  - her Marcelline Deist will be discontinued, risk outweighs benefit for this patient (she does not  have history of CHF or kidney problems).   - she is not an ideal candidate for GLP1 therapy due to heavy smoking history which increases risk of pancreatitis- nor did she tolerate this class of medications in the past.  - Specific targets for  A1c; LDL, HDL, and Triglycerides were discussed with the patient.  2) Blood Pressure /Hypertension:  her blood pressure is controlled to target.   she is advised to continue her current medications including Lisinopril 20 mg p.o. daily with breakfast and Metoprolol 25 mg po daily.  3) Lipids/Hyperlipidemia:    There is no recent lipid panel available to review .  she is advised to continue Simvastatin 40 mg daily at bedtime.  Side effects and precautions discussed with her.  4)  Weight/Diet:  her Body mass index is 31.79 kg/m.  -   she is a candidate for mild weight loss. I discussed with her the fact that loss of 5 - 10% of her  current body weight will have the most impact on her diabetes management.  Exercise, and detailed carbohydrates information provided  -  detailed on discharge instructions.  5) Chronic Care/Health Maintenance: -she is on ACEI/ARB and Statin medications and is encouraged to initiate and continue to follow up with Ophthalmology, Dentist, Podiatrist at least yearly or according to recommendations, and advised to QUIT SMOKING. I have recommended yearly flu vaccine and pneumonia vaccine at least every 5 years; moderate intensity exercise for up to 150 minutes weekly; and sleep for at least 7 hours a day.  - she is advised to maintain close follow up with Bucio, Julian Reil, FNP for primary care needs, as well as her other providers for optimal and coordinated care.     I spent  40  minutes in the care of the patient today including review of labs from CMP, Lipids, Thyroid Function, Hematology (current and previous including abstractions from other facilities); face-to-face time discussing  her blood glucose readings/logs, discussing  hypoglycemia and hyperglycemia episodes and symptoms, medications doses, her options of short and long term treatment based on the latest standards of care / guidelines;  discussion about incorporating lifestyle medicine;  and documenting the encounter. Risk reduction counseling performed per USPSTF guidelines to reduce obesity and cardiovascular risk factors.     Please refer to Patient Instructions for Blood Glucose Monitoring and Insulin/Medications Dosing Guide"  in media tab for additional information. Please  also refer to " Patient Self Inventory" in the Media  tab for reviewed elements of pertinent patient history.  Arlana Pouch participated in the discussions, expressed understanding, and voiced agreement with the above plans.  All questions were answered to her satisfaction. she is encouraged to contact clinic should she have any questions or concerns prior to her return visit.  Follow up plan: - Return in about 3 months (around 10/06/2023) for Diabetes F/U with A1c in office, No previsit labs, Bring meter and logs.   Ronny Bacon, [facility] [address] [place], [place] [address] Phone: [phone] Fax: [phone]  07/07/2023, 4:16 PM

## 2023-08-22 IMAGING — MG MM DIGITAL SCREENING BILAT W/ TOMO AND CAD
8 series · 8 of 24 positions shown · non-contrast
Comparison: Previous exam(s).

CLINICAL DATA: Screening.

EXAM:
DIGITAL SCREENING BILATERAL MAMMOGRAM WITH TOMOSYNTHESIS AND CAD
TECHNIQUE: Bilateral screening digital craniocaudal and mediolateral oblique
mammograms were obtained. Bilateral screening digital breast
tomosynthesis was performed. The images were evaluated with
computer-aided detection.

[L MLO synth-2D]
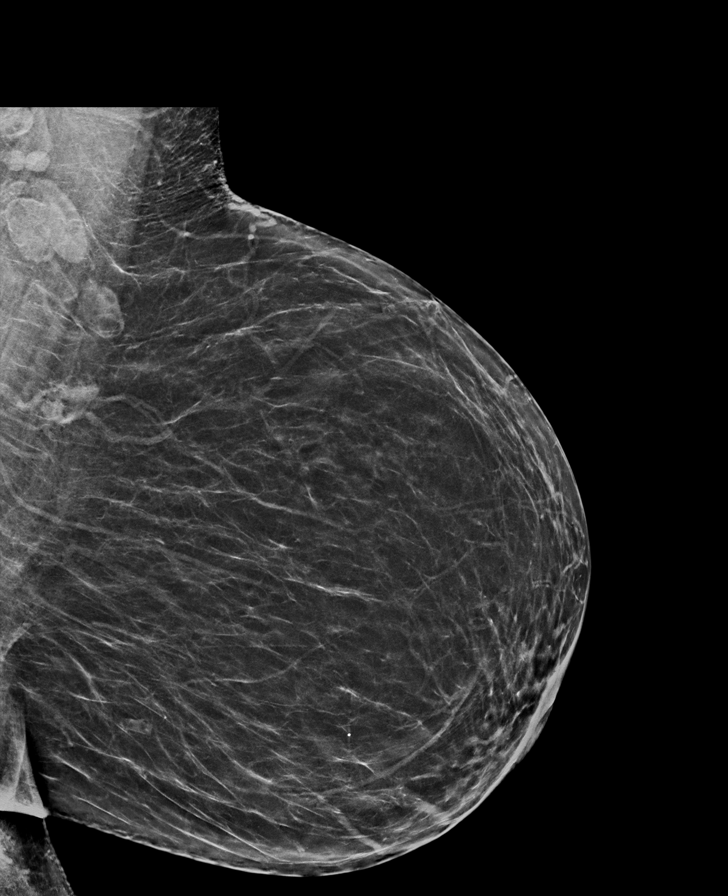

[L CC synth-2D]
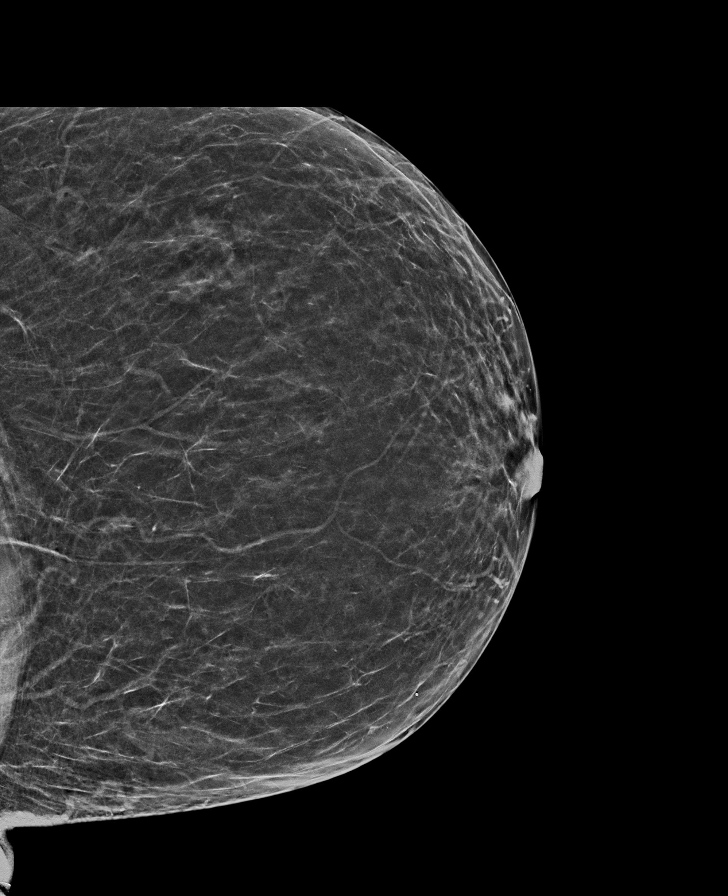

[R MLO synth-2D]
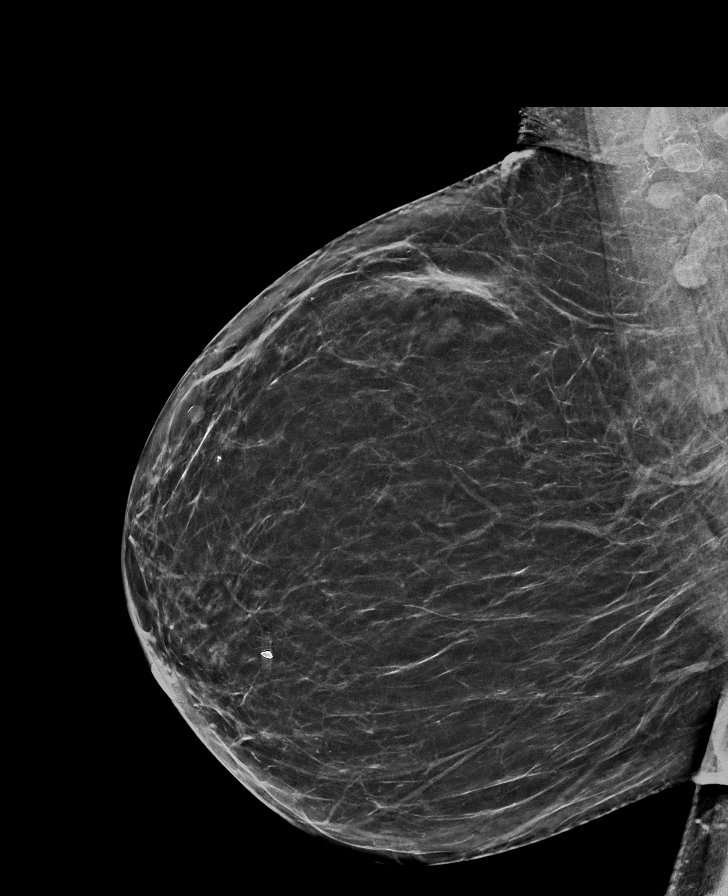

[R CC synth-2D]
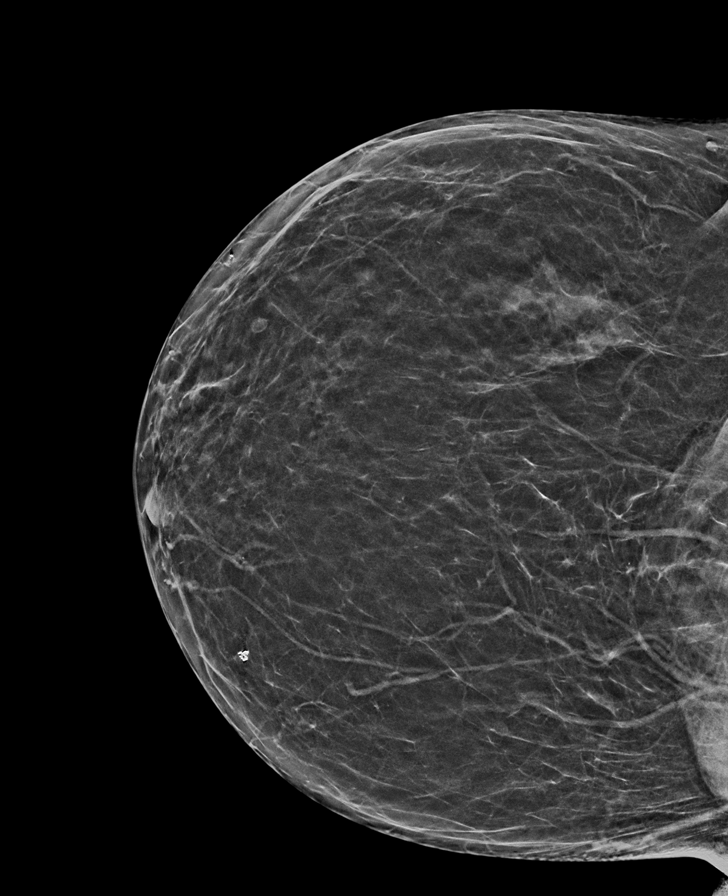

[R CC tomo · tomo slice 29/56.0]
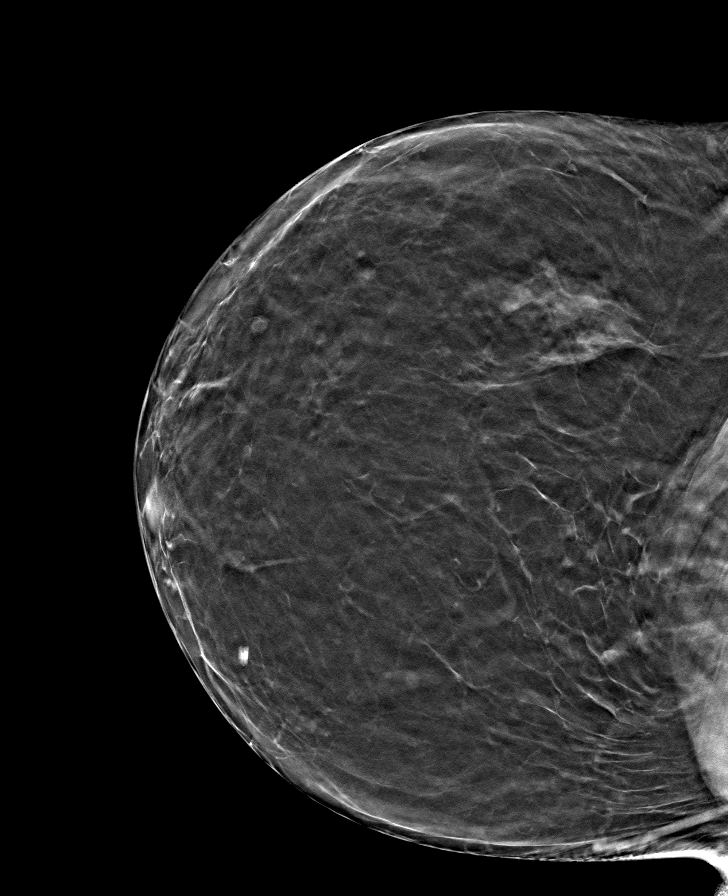

[L CC tomo · tomo slice 29/58.0]
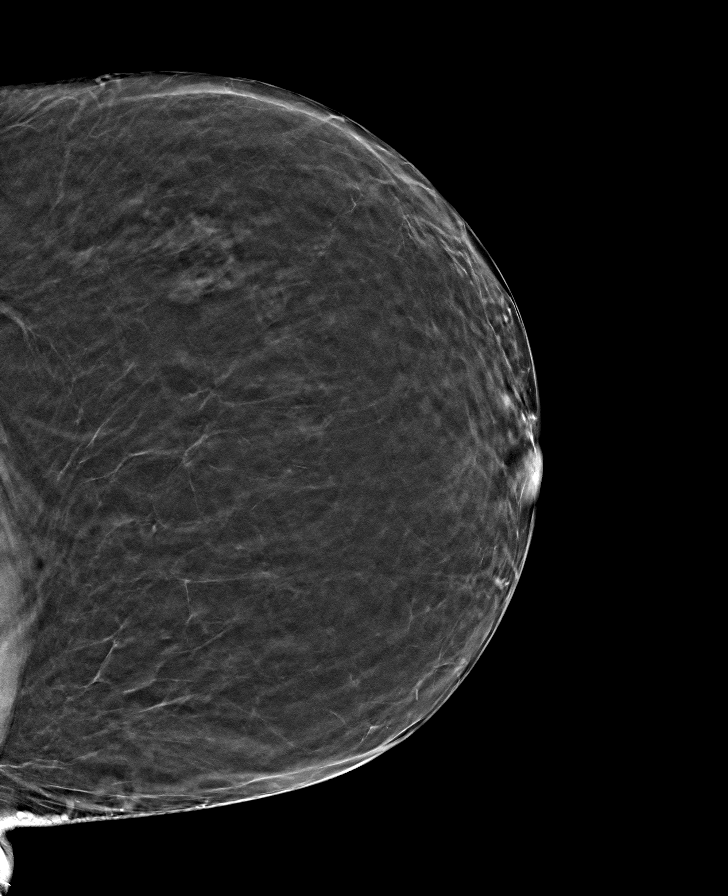

[R MLO tomo · tomo slice 35/68.0]
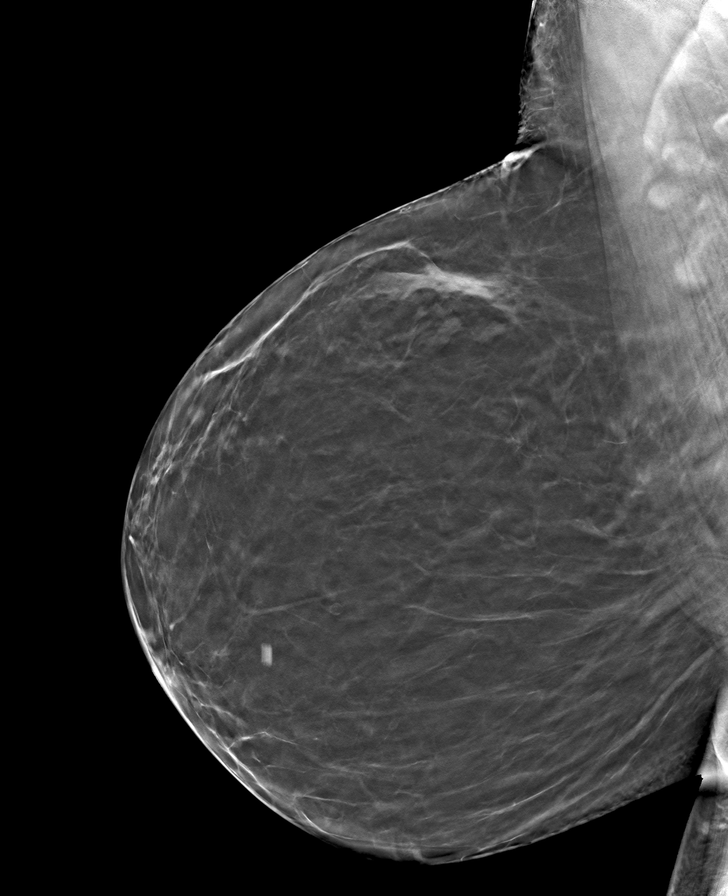

[L MLO tomo · tomo slice 35/69.0]
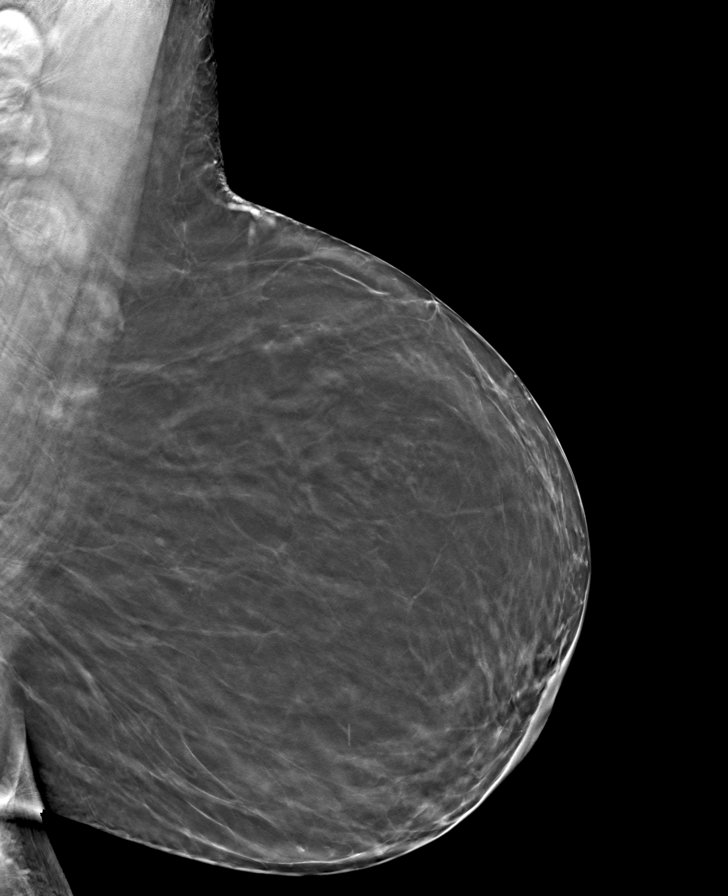

[8 of 24 positions shown; findings below may reference images not displayed]

ACR Breast Density Category b: There are scattered areas of
fibroglandular density.
FINDINGS: There are no findings suspicious for malignancy.
IMPRESSION: No mammographic evidence of malignancy. A result letter of this
screening mammogram will be mailed directly to the patient.

RECOMMENDATION:
Screening mammogram in one year. (Code:51-O-LD2)

BI-RADS CATEGORY  1: Negative.

## 2023-08-23 ENCOUNTER — Other Ambulatory Visit: Payer: Self-pay | Admitting: *Deleted

## 2023-08-23 ENCOUNTER — Telehealth: Payer: Self-pay | Admitting: *Deleted

## 2023-08-23 DIAGNOSIS — Z794 Long term (current) use of insulin: Secondary | ICD-10-CM

## 2023-08-23 DIAGNOSIS — Z7984 Long term (current) use of oral hypoglycemic drugs: Secondary | ICD-10-CM

## 2023-08-23 DIAGNOSIS — E1165 Type 2 diabetes mellitus with hyperglycemia: Secondary | ICD-10-CM

## 2023-08-23 MED ORDER — DEXCOM G7 SENSOR MISC: 1.0000 | 3 refills | Status: DC

## 2023-08-23 NOTE — Telephone Encounter (Signed)
Patient called the office,she shares that when she got her sensors it was the G 6 and she has the G 7. The G 6 ,she needs to have a transmitter, which she does not have. Patient tried to use it with her G 7 and could not. She has been using her regular machine for her blood glucose checks.  I checked and we have not sent in for the G 6.I called the [facility] in [place], spoke with Nadine Counts, the pharmacist. He shares that this prescription came form Col[facility] Caroli, FNP.   Patient called back and advised to talk with Nadine Counts about this , to see what may be done. I have sent in a prescription for her Dexcom G7 sensors.

## 2023-08-29 DIAGNOSIS — K760 Fatty (change of) liver, not elsewhere classified: Secondary | ICD-10-CM | POA: Diagnosis not present

## 2023-08-29 DIAGNOSIS — R1030 Lower abdominal pain, unspecified: Secondary | ICD-10-CM | POA: Diagnosis not present

## 2023-08-29 DIAGNOSIS — K4090 Unilateral inguinal hernia, without obstruction or gangrene, not specified as recurrent: Secondary | ICD-10-CM | POA: Diagnosis not present

## 2023-09-12 ENCOUNTER — Other Ambulatory Visit: Payer: Self-pay | Admitting: *Deleted

## 2023-09-12 ENCOUNTER — Telehealth: Payer: Self-pay | Admitting: *Deleted

## 2023-09-12 MED ORDER — INSULIN GLARGINE 100 UNIT/ML SOLOSTAR PEN: PEN_INJECTOR | SUBCUTANEOUS | 1 refills | Status: DC

## 2023-09-12 NOTE — Telephone Encounter (Signed)
Patient has left a message that she will need another long acting insulin. The pharmacy, when she ask for a refill on there Levemir, advised her that this was not being made any longer. We will call in another long acting insulin and make the patient aware.

## 2023-10-17 ENCOUNTER — Encounter: Payer: Self-pay | Admitting: Nurse Practitioner

## 2023-10-17 ENCOUNTER — Ambulatory Visit (INDEPENDENT_AMBULATORY_CARE_PROVIDER_SITE_OTHER): Payer: Managed Care, Other (non HMO) | Admitting: Nurse Practitioner

## 2023-10-17 VITALS — BP 140/78 | HR 71 | Ht 65.5 in | Wt 198.8 lb

## 2023-10-17 DIAGNOSIS — E1165 Type 2 diabetes mellitus with hyperglycemia: Secondary | ICD-10-CM | POA: Diagnosis not present

## 2023-10-17 DIAGNOSIS — E782 Mixed hyperlipidemia: Secondary | ICD-10-CM

## 2023-10-17 DIAGNOSIS — Z7984 Long term (current) use of oral hypoglycemic drugs: Secondary | ICD-10-CM | POA: Diagnosis not present

## 2023-10-17 DIAGNOSIS — Z794 Long term (current) use of insulin: Secondary | ICD-10-CM

## 2023-10-17 DIAGNOSIS — I10 Essential (primary) hypertension: Secondary | ICD-10-CM

## 2023-10-17 LAB — POCT GLYCOSYLATED HEMOGLOBIN (HGB A1C): Hemoglobin A1C: 8.8 % — AB (ref 4.0–5.6)

## 2023-10-17 MED ORDER — DEXCOM G7 SENSOR MISC: 1.0000 | 3 refills | Status: DC

## 2023-10-17 MED ORDER — INSULIN LISPRO (1 UNIT DIAL) 100 UNIT/ML (KWIKPEN): 8.0000 [IU] | PEN_INJECTOR | Freq: Three times a day (TID) | SUBCUTANEOUS | 3 refills | Status: AC

## 2023-10-17 MED ORDER — INSULIN GLARGINE 100 UNIT/ML SOLOSTAR PEN: 30.0000 [IU] | PEN_INJECTOR | Freq: Every day | SUBCUTANEOUS | 3 refills | Status: DC

## 2023-10-17 MED ORDER — SITAGLIPTIN PHOSPHATE 50 MG PO TABS: 50.0000 mg | ORAL_TABLET | Freq: Every day | ORAL | 1 refills | Status: AC

## 2023-10-17 NOTE — Progress Notes (Signed)
Endocrinology Follow Up Note       10/17/2023, 4:04 PM   Subjective:    Patient ID: Heather Sloan, female    DOB: Mar 15, 1966.  Heather Sloan is being seen in follow up after being seen in consultation for management of currently uncontrolled symptomatic diabetes requested by  Bucio, Julian Reil, FNP.   Past Medical History:  Diagnosis Date   Arthritis    "qwhere" (08/04/2018)   Depression    hx   Fatty liver disease, nonalcoholic    [place] 2013 [place], Hepatitis Labs negative   Frequent sinus infections    GERD (gastroesophageal reflux disease)    EGD March 2018, neg H pylori   Hyperlipidemia    Hypertension    Migraine    "3-4/year now" (08/04/2018)   OSA on CPAP    Pneumonia 2006   "right after I had pneumonia shot"   PVC (premature ventricular contraction)    Holter Monitor  2015   Type II diabetes mellitus (HCC)     Past Surgical History:  Procedure Laterality Date   FOOT TENDON SURGERY Right 03/2017   "took tendon out; moved tendon behind that tendon to put in it's place; cut heel off, moved heel over, put 3 inch screw in to hold it in place"   LAPAROSCOPIC CHOLECYSTECTOMY  1990s   THROAT SURGERY  2017   "thought I had an abcess; wasn't that; had acute peri-arthritis"    Social History   Socioeconomic History   Marital status: Married    Spouse name: Rosanne Ashing   Number of children: 1   Years of education: 12   Highest education level: Not on file  Occupational History    Comment: Location manager  Tobacco Use   Smoking status: Every Day    Current packs/day: 0.75    Average packs/day: 0.8 packs/day for 39.0 years (29.3 ttl pk-yrs)    Types: Cigarettes    Start date: 10/13/1984   Smokeless tobacco: Never  Vaping Use   Vaping status: Never Used  Substance and Sexual Activity   Alcohol use: Yes    Comment: occ   Drug use: Never   Sexual activity: Not Currently  Other Topics Concern   Not on file   Social History Narrative   Lives with husband   Caffeine- coffee, 1 cup daily, soda 3 cans daily   Social Drivers of Corporate investment banker Strain: Not on file  Food Insecurity: Not on file  Transportation Needs: No Transportation Needs (08/17/2022)   [facility] from [facility], [facility]   PRAPARE - Transportation    Lack of Transportation (Medical): No    Lack of Transportation (Non-Medical): No  Physical Activity: Not on file  Stress: Not on file  Social Connections: Not on file    Family History  Problem Relation Age of Onset   Diabetes Mother    Diabetes Father    Cancer Father        Abdominal stromal tumor    Heart disease Father    Heart disease Sister        HEART ATTACK   Diabetes Maternal Grandmother    Cancer Paternal Grandfather  Prostate/Colon Cancer   Healthy Son     Outpatient Encounter Medications as of 10/17/2023  Medication Sig   aspirin 81 MG chewable tablet Chew 81 mg by mouth daily.    cholecalciferol (VITAMIN D3) 25 MCG (1000 UNIT) tablet Take 1,000 Units by mouth daily.   CINNAMON PO Take by mouth.   clobetasol cream (TEMOVATE) 0.05 % Apply 1 application topically 2 (two) times daily. (Patient taking differently: Apply 1 application  topically as needed.)   Coenzyme Q10 400 MG CAPS Take by mouth.   diclofenac (VOLTAREN) 75 MG EC tablet Take 1 tablet by mouth twice daily   gabapentin (NEURONTIN) 300 MG capsule TAKE 1 CAPSULE BY MOUTH THREE TIMES DAILY   hydrOXYzine (ATARAX) 25 MG tablet Take 25 mg by mouth every 8 (eight) hours as needed.   lisinopril (ZESTRIL) 20 MG tablet Take 1 tablet by mouth once daily   metFORMIN (GLUCOPHAGE) 1000 MG tablet 500mg  PO Q AM 1000mg  PO Q HS   methocarbamol (ROBAXIN) 500 MG tablet TAKE 1 TABLET BY MOUTH EVERY 8 HOURS AS NEEDED FOR MUSCLE SPASM   metoprolol succinate (TOPROL-XL) 25 MG 24 hr tablet Take 25 mg by mouth daily.   montelukast (SINGULAIR) 10 MG tablet GIVE 1 TABLET BY MOUTH AT  BEDTIME AS NEEDED FOR ALLERGIES   omeprazole (PRILOSEC) 40 MG capsule Take 1 capsule by mouth once daily   sertraline (ZOLOFT) 50 MG tablet Take 50 mg by mouth daily.   simvastatin (ZOCOR) 40 MG tablet TAKE 1 TABLET BY MOUTH ONCE DAILY AT  6  PM   sitaGLIPtin (JANUVIA) 50 MG tablet Take 1 tablet (50 mg total) by mouth daily.   traMADol (ULTRAM) 50 MG tablet TAKE 1 TABLET BY MOUTH AT BEDTIME AS NEEDED   [DISCONTINUED] Continuous Glucose Sensor (DEXCOM G7 SENSOR) MISC Inject 1 Application into the skin as directed. Change sensor every 10 days as directed.   [DISCONTINUED] insulin glargine (LANTUS) 100 UNIT/ML Solostar Pen Patient is to inject 25 units into skin at bedtime as instructed.   [DISCONTINUED] insulin lispro (HUMALOG) 100 UNIT/ML KwikPen Inject 8 Units into the skin 3 (three) times daily. Sliding Scale   Continuous Glucose Sensor (DEXCOM G7 SENSOR) MISC Inject 1 Application into the skin as directed. Change sensor every 10 days as directed.   insulin glargine (LANTUS) 100 UNIT/ML Solostar Pen Inject 30 Units into the skin at bedtime. Patient is to inject 25 units into skin at bedtime as instructed.   insulin lispro (HUMALOG) 100 UNIT/ML KwikPen Inject 8-14 Units into the skin 3 (three) times daily. Sliding Scale   [DISCONTINUED] LEVEMIR FLEXPEN 100 UNIT/ML FlexPen Inject 25 Units into the skin at bedtime. (Patient not taking: Reported on 10/17/2023)   [DISCONTINUED] linagliptin (TRADJENTA) 5 MG TABS tablet Take 1 tablet (5 mg total) by mouth daily. (Patient not taking: Reported on 10/17/2023)   No facility-administered encounter medications on file as of 10/17/2023.    ALLERGIES: Allergies  Allergen Reactions   Codeine Other (See Comments)    Passed out   Bactrim [Sulfamethoxazole-Trimethoprim] Hives   Wheat Extract Diarrhea and Other (See Comments)    Irritable bowel, bloating    VACCINATION STATUS: Immunization History  Administered Date(s) Administered   H1N1 09/24/2008    Influenza Split 08/26/2006, 11/03/2011, 09/09/2015, 09/07/2016   Influenza Whole 08/01/2013   Influenza,inj,Quad PF,6+ Mos 10/13/2017, 07/25/2019, 08/15/2020   Pneumococcal Polysaccharide-23 10/26/2005   Td 06/28/2003   Tdap 06/04/2013, 07/28/2020    Diabetes She presents for her follow-up diabetic visit.  She has type 2 diabetes mellitus. Onset time: diagnosed at approx age of 48. Her disease course has been worsening. There are no hypoglycemic associated symptoms. Associated symptoms include fatigue. Pertinent negatives for diabetes include no polydipsia and no polyuria. There are no hypoglycemic complications. Symptoms are stable. There are no diabetic complications. Risk factors for coronary artery disease include diabetes mellitus, family history, hypertension and tobacco exposure. Current diabetic treatment includes intensive insulin program and oral agent (monotherapy). She is compliant with treatment most of the time. Her weight is fluctuating minimally. She is following a generally unhealthy diet. When asked about meal planning, she reported none. She has not had a previous visit with a dietitian. She participates in exercise intermittently. Her home blood glucose trend is increasing steadily. Her overall blood glucose range is 180-200 mg/dl. (She presents today with her CGM showing above target glycemic profile overall.  Her POCT A1c today is 8.8%, increasing from last visit of 8.5%.  She notes she recently had a head cold and sinus infection and has struggled getting her glucose back on track.  Analysis of her CGM shows TIR 39%, TAR 61%, TBR 0% with a GMI of 8.1%.) An ACE inhibitor/angiotensin II receptor blocker is being taken. She sees a podiatrist.Eye exam is not current.    Review of systems  Constitutional: + Minimally fluctuating body weight,  current Body mass index is 32.58 kg/m. , no fatigue, no subjective hyperthermia, no subjective hypothermia Eyes: no blurry vision, no  xerophthalmia ENT: no sore throat, no nodules palpated in throat, no dysphagia/odynophagia, no hoarseness Cardiovascular: no chest pain, no shortness of breath, no palpitations, no leg swelling Respiratory: no cough, no shortness of breath Gastrointestinal: no nausea/vomiting/diarrhea Musculoskeletal: no muscle/joint aches Skin: no rashes, no hyperemia Neurological: no tremors, no numbness, no tingling, no dizziness Psychiatric: no depression, no anxiety  Objective:     BP (!) 140/78 (BP Location: Right Arm, Patient Position: Sitting) Comment: Retake with manuel cuff. Patient follows Colvin Caroli for her blood pressure , Adelfo Diebel made aware.  Pulse 71   Ht 5' 5.5" (1.664 m)   Wt 198 lb 12.8 oz (90.2 kg)   LMP 09/02/2011 (Approximate)   BMI 32.58 kg/m   Wt Readings from Last 3 Encounters:  10/17/23 198 lb 12.8 oz (90.2 kg)  07/07/23 194 lb (88 kg)  05/10/23 190 lb (86.2 kg)     BP Readings from Last 3 Encounters:  10/17/23 (!) 140/78  07/07/23 (!) 136/54  05/10/23 134/79     Physical Exam- Limited  Constitutional:  Body mass index is 32.58 kg/m. , not in acute distress, normal state of mind Eyes:  EOMI, no exophthalmos Musculoskeletal: no gross deformities, strength intact in all four extremities, no gross restriction of joint movements Skin:  no rashes, no hyperemia, nicotinic discoloration to fingernails bilaterally Neurological: no tremor with outstretched hands   Diabetic Foot Exam - Simple   No data filed     CMP ( most recent) CMP     Component Value Date/Time   NA 136 06/16/2021 0908   K 5.1 06/16/2021 0908   CL 102 06/16/2021 0908   CO2 27 06/16/2021 0908   GLUCOSE 251 (H) 06/16/2021 0908   BUN 13 06/16/2021 0908   CREATININE 0.85 06/16/2021 0908   CALCIUM 9.3 06/16/2021 0908   PROT 6.7 06/16/2021 0908   PROT 6.6 06/16/2021 0908   ALBUMIN 3.7 08/09/2018 1107   AST 19 06/16/2021 0908   ALT 31 (H) 06/16/2021 0908   ALKPHOS  54 08/09/2018 1107    BILITOT 0.3 06/16/2021 0908   GFRNONAA 54 (L) 09/24/2020 1529   GFRAA 62 09/24/2020 1529     Diabetic Labs (most recent): Lab Results  Component Value Date   HGBA1C 8.8 (A) 10/17/2023   HGBA1C 8.5 (A) 07/07/2023   HGBA1C 8.8 (H) 08/15/2020   MICROALBUR 10 mg/L 04/06/2023   MICROALBUR 0.5 12/10/2019   MICROALBUR 2.5 02/07/2018     Lipid Panel ( most recent) Lipid Panel     Component Value Date/Time   CHOL 144 08/15/2020 1550   TRIG 112 08/15/2020 1550   HDL 39 (L) 08/15/2020 1550   CHOLHDL 3.7 08/15/2020 1550   VLDL 38 08/05/2018 0223   LDLCALC 84 08/15/2020 1550   LDLDIRECT 94 04/14/2020 1546      Lab Results  Component Value Date   TSH 1.83 06/16/2021   TSH 3.835 08/05/2018                      Assessment & Plan:   1) Type 2 diabetes mellitus with hyperglycemia, with long-term current use of insulin (HCC)  She presents today with her CGM showing above target glycemic profile overall.  Her POCT A1c today is 8.8%, increasing from last visit of 8.5%.  She notes she recently had a head cold and sinus infection and has struggled getting her glucose back on track.  Analysis of her CGM shows TIR 39%, TAR 61%, TBR 0% with a GMI of 8.1%.  - Heather Sloan has currently uncontrolled symptomatic type 2 DM since 57 years of age.   -Recent labs reviewed.  - I had a long discussion with her about the progressive nature of diabetes and the pathology behind its complications. -her diabetes is not currently complicated but she remains at a high risk for more acute and chronic complications which include CAD, CVA, CKD, retinopathy, and neuropathy. These are all discussed in detail with her.  The following Lifestyle Medicine recommendations according to American College of Lifestyle Medicine [facility]) were discussed and offered to patient and she agrees to start the journey:  A. Whole Foods, Plant-based plate comprising of fruits and vegetables, plant-based proteins,  whole-grain carbohydrates was discussed in detail with the patient.   A list for source of those nutrients were also provided to the patient.  Patient will use only water or unsweetened tea for hydration. B.  The need to stay away from risky substances including alcohol, smoking; obtaining 7 to 9 hours of restorative sleep, at least 150 minutes of moderate intensity exercise weekly, the importance of healthy social connections,  and stress reduction techniques were discussed. C.  A full color page of  Calorie density of various food groups per pound showing examples of each food groups was provided to the patient.  - Nutritional counseling repeated at each appointment due to patients tendency to fall back in to old habits.  - The patient admits there is a room for improvement in their diet and drink choices. -  Suggestion is made for the patient to avoid simple carbohydrates from their diet including Cakes, Sweet Desserts / Pastries, Ice Cream, Soda (diet and regular), Sweet Tea, Candies, Chips, Cookies, Sweet Pastries, Store Bought Juices, Alcohol in Excess of 1-2 drinks a day, Artificial Sweeteners, Coffee Creamer, and "Sugar-free" Products. This will help patient to have stable blood glucose profile and potentially avoid unintended weight gain.   - I encouraged the patient to switch to unprocessed or minimally processed complex starch  and increased protein intake (animal or plant source), fruits, and vegetables.   - Patient is advised to stick to a routine mealtimes to eat 3 meals a day and avoid unnecessary snacks (to snack only to correct hypoglycemia).  - I have approached her with the following individualized plan to manage her diabetes and patient agrees:   -She is advised to increase her Lantus to 30 units SQ nightly and continue Humalog 8-11 units TID with meals if glucose is above 90 and she is eating (Specific instructions on how to titrate insulin dosage based on glucose readings given to  patient in writing).  She can continue Metformin 1000 mg po twice daily with meals.  I sent in for Tradjenta at last visit but her insurance did not cover it.  Will try sending in for Januvia 50 mg po daily to see if that brand is covered.  She is interested in coming off Metformin in the future.  -she is encouraged to continue monitoring glucose 4 times daily, before meals and before bed (using her CGM), and to call the clinic if she has readings less than 70 or above 300 for 3 tests in a row.    - she is warned not to take insulin without proper monitoring per orders. - Adjustment parameters are given to her for hypo and hyperglycemia in writing.  - her Marcelline Deist will be discontinued, risk outweighs benefit for this patient (she does not have history of CHF or kidney problems).   - she is not an ideal candidate for GLP1 therapy due to heavy smoking history which increases risk of pancreatitis- nor did she tolerate this class of medications in the past.  - Specific targets for  A1c; LDL, HDL, and Triglycerides were discussed with the patient.  2) Blood Pressure /Hypertension:  her blood pressure is controlled to target.   she is advised to continue her current medications including Lisinopril 20 mg p.o. daily with breakfast and Metoprolol 25 mg po daily.  3) Lipids/Hyperlipidemia:    There is no recent lipid panel available to review .  she is advised to continue Simvastatin 40 mg daily at bedtime.  Side effects and precautions discussed with her.  Will recheck lipid panel prior to next visit.  4)  Weight/Diet:  her Body mass index is 32.58 kg/m.  -   she is a candidate for mild weight loss. I discussed with her the fact that loss of 5 - 10% of her  current body weight will have the most impact on her diabetes management.  Exercise, and detailed carbohydrates information provided  -  detailed on discharge instructions.  5) Chronic Care/Health Maintenance: -she is on ACEI/ARB and Statin  medications and is encouraged to initiate and continue to follow up with Ophthalmology, Dentist, Podiatrist at least yearly or according to recommendations, and advised to QUIT SMOKING. I have recommended yearly flu vaccine and pneumonia vaccine at least every 5 years; moderate intensity exercise for up to 150 minutes weekly; and sleep for at least 7 hours a day.  - she is advised to maintain close follow up with Bucio, Julian Reil, FNP for primary care needs, as well as her other providers for optimal and coordinated care.     I spent  43  minutes in the care of the patient today including review of labs from CMP, Lipids, Thyroid Function, Hematology (current and previous including abstractions from other facilities); face-to-face time discussing  her blood glucose readings/logs, discussing hypoglycemia and hyperglycemia episodes  and symptoms, medications doses, her options of short and long term treatment based on the latest standards of care / guidelines;  discussion about incorporating lifestyle medicine;  and documenting the encounter. Risk reduction counseling performed per USPSTF guidelines to reduce obesity and cardiovascular risk factors.     Please refer to Patient Instructions for Blood Glucose Monitoring and Insulin/Medications Dosing Guide"  in media tab for additional information. Please  also refer to " Patient Self Inventory" in the Media  tab for reviewed elements of pertinent patient history.  Arlana Pouch participated in the discussions, expressed understanding, and voiced agreement with the above plans.  All questions were answered to her satisfaction. she is encouraged to contact clinic should she have any questions or concerns prior to her return visit.     Follow up plan: - Return in about 3 months (around 01/15/2024) for Diabetes F/U with A1c in office, Previsit labs, Bring meter and logs.  Ronny Bacon, [facility] [address] [place], [place] [address] Phone: [phone] Fax: [phone]  10/17/2023, 4:04 PM

## 2024-01-12 ENCOUNTER — Telehealth: Payer: Self-pay | Admitting: *Deleted

## 2024-01-12 NOTE — Telephone Encounter (Signed)
 Patient left a voicemail, ask that you know that she cannot afford the Dexcom G7 . She is asking is there anything that is cheaper?

## 2024-01-13 NOTE — Telephone Encounter (Signed)
 https://[email or web address].s3.us-west-2.amazonaws.com/g7-g6-cash-pay-tearpad.pdf  This is a website where I found a coupon card to help cover the cost of the Dexcom, perhaps it will bring the price down enough that she can afford it.  Otherwise she can call her insurance company and see if Josephine Igo is cheaper?

## 2024-01-13 NOTE — Telephone Encounter (Signed)
Patient was called and a message was left. 

## 2024-01-16 ENCOUNTER — Telehealth: Payer: Self-pay | Admitting: Nurse Practitioner

## 2024-01-16 NOTE — Telephone Encounter (Signed)
 Pt left a VM asking if she can have her sliding scale info. She lost her job and cannot come back right now. Please call pt

## 2024-01-18 NOTE — Telephone Encounter (Signed)
 I have printed out the last copy of her report/instructions. Will have Whitney to review.

## 2024-01-19 ENCOUNTER — Telehealth: Payer: Self-pay | Admitting: *Deleted

## 2024-01-19 NOTE — Telephone Encounter (Signed)
 Patient was called and made aware that the sliding scale paper work was ready for her to pick up.

## 2024-01-24 ENCOUNTER — Ambulatory Visit: Payer: Managed Care, Other (non HMO) | Admitting: Nurse Practitioner

## 2024-01-24 NOTE — Telephone Encounter (Signed)
 Mailed to pt

## 2024-05-15 ENCOUNTER — Other Ambulatory Visit (HOSPITAL_COMMUNITY): Payer: Self-pay

## 2024-05-15 ENCOUNTER — Telehealth: Payer: Self-pay | Admitting: Pharmacy Technician

## 2024-05-15 NOTE — Telephone Encounter (Signed)
 Pharmacy Patient Advocate Encounter   Received notification from CoverMyMeds that prior authorization for Dexcom G7 Sensor is required/requested.   Insurance verification completed.   The patient is insured through [employer] .   Per test claim: PA required; PA submitted to above mentioned insurance via CoverMyMeds Key/confirmation #/EOC [id number]  Status is pending

## 2024-05-18 ENCOUNTER — Other Ambulatory Visit (HOSPITAL_COMMUNITY): Payer: Self-pay

## 2024-05-21 ENCOUNTER — Other Ambulatory Visit (HOSPITAL_COMMUNITY): Payer: Self-pay

## 2024-05-21 NOTE — Telephone Encounter (Signed)
 Pharmacy Patient Advocate Encounter  Received notification from [employer] that Prior Authorization for Dexcom G7 Sensor has been APPROVED from 05/15/24 to 05/19/25. Ran test claim, Copay is $381.72. This test claim was processed through [facility]- copay amounts may vary at other pharmacies due to pharmacy/plan contracts, or as the patient moves through the different stages of their insurance plan.   PA #/Case ID/Reference #: [id number]

## 2024-06-18 ENCOUNTER — Telehealth: Payer: Self-pay | Admitting: *Deleted

## 2024-06-18 ENCOUNTER — Other Ambulatory Visit: Payer: Self-pay | Admitting: *Deleted

## 2024-06-18 DIAGNOSIS — Z7984 Long term (current) use of oral hypoglycemic drugs: Secondary | ICD-10-CM

## 2024-06-18 DIAGNOSIS — Z794 Long term (current) use of insulin: Secondary | ICD-10-CM

## 2024-06-18 DIAGNOSIS — E1165 Type 2 diabetes mellitus with hyperglycemia: Secondary | ICD-10-CM

## 2024-06-18 MED ORDER — DEXCOM G7 SENSOR MISC: 1.0000 | 3 refills | Status: AC

## 2024-06-18 NOTE — Telephone Encounter (Signed)
Called and LVM to r/s

## 2024-06-18 NOTE — Telephone Encounter (Signed)
 Patient has left a message that she has a new insurance, and that she would like to make another appointment. [phone].

## 2024-07-10 ENCOUNTER — Other Ambulatory Visit: Payer: Self-pay | Admitting: Nurse Practitioner

## 2024-07-10 MED ORDER — INSULIN GLARGINE 100 UNIT/ML SOLOSTAR PEN: 35.0000 [IU] | PEN_INJECTOR | Freq: Every day | SUBCUTANEOUS | 0 refills | Status: AC
